# Patient Record
Sex: Male | Born: 1955 | Race: Black or African American | Hispanic: No | Marital: Single | Smoking: Former smoker
Health system: Southern US, Community
[De-identification: ages and names within clinical notes are randomized; demographics above are authoritative.]

## PROBLEM LIST (undated history)

## (undated) DIAGNOSIS — Z5189 Encounter for other specified aftercare: Secondary | ICD-10-CM

## (undated) DIAGNOSIS — I10 Essential (primary) hypertension: Secondary | ICD-10-CM

## (undated) HISTORY — PX: APPENDECTOMY: SHX54

## (undated) HISTORY — DX: Essential (primary) hypertension: I10

## (undated) HISTORY — DX: Encounter for other specified aftercare: Z51.89

## (undated) HISTORY — PX: TOTAL HIP ARTHROPLASTY: SHX124

---

## 2012-01-11 ENCOUNTER — Encounter (HOSPITAL_COMMUNITY): Payer: Self-pay | Admitting: *Deleted

## 2012-01-11 ENCOUNTER — Emergency Department (HOSPITAL_COMMUNITY)
Admission: EM | Admit: 2012-01-11 | Discharge: 2012-01-11 | Disposition: A | Discharge status: Home / Self Care | Payer: Self-pay | Attending: Emergency Medicine | Admitting: Emergency Medicine

## 2012-01-11 DIAGNOSIS — M62838 Other muscle spasm: Secondary | ICD-10-CM

## 2012-01-11 DIAGNOSIS — Y9269 Other specified industrial and construction area as the place of occurrence of the external cause: Secondary | ICD-10-CM | POA: Insufficient documentation

## 2012-01-11 DIAGNOSIS — X500XXA Overexertion from strenuous movement or load, initial encounter: Secondary | ICD-10-CM | POA: Insufficient documentation

## 2012-01-11 DIAGNOSIS — M5380 Other specified dorsopathies, site unspecified: Secondary | ICD-10-CM | POA: Insufficient documentation

## 2012-01-11 MED ORDER — CYCLOBENZAPRINE HCL 10 MG PO TABS: 10.0000 mg | ORAL_TABLET | Freq: Two times a day (BID) | ORAL | Status: AC | PRN

## 2012-01-11 NOTE — ED Provider Notes (Signed)
History     CSN: [id number]  Arrival date & time 01/11/12  7829   First MD Initiated Contact with Patient 01/11/12 0803      8:29 AM HPI Patient reports yesterday he was working in the [employer]. Reports lifting and bending excessively. States waking up this morning with severe lower back spasms. Reports trying BenGay without relief. States similar symptoms 4 months ago. Denies radiation of pain into lower extremities. Denies numbness, tingling, weakness, saddle anesthesias, perineal numbness, incontinence, abdominal pain, nausea, vomiting, dysuria, hematuria.  Patient is a 56 y.o. male presenting with back pain.  Back Pain  This is a new problem. The current episode started yesterday. The problem occurs constantly. The problem has been gradually worsening. The pain is associated with lifting heavy objects. The pain is present in the lumbar spine. The quality of the pain is described as aching. The pain does not radiate. The pain is moderate. The symptoms are aggravated by bending, certain positions and twisting. Pertinent negatives include no chest pain, no fever, no numbness, no headaches, no abdominal pain, no bowel incontinence, no perianal numbness, no bladder incontinence, no dysuria, no pelvic pain, no leg pain, no paresthesias, no paresis, no tingling and no weakness.    History reviewed. No pertinent past medical history.  History reviewed. No pertinent past surgical history.  History reviewed. No pertinent family history.  History  Substance Use Topics  . Smoking status: Not on file  . Smokeless tobacco: Not on file  . Alcohol Use: Not on file      Review of Systems  Constitutional: Negative for fever and chills.  HENT: Negative for neck pain.   Respiratory: Negative for cough and shortness of breath.   Cardiovascular: Negative for chest pain and palpitations.  Gastrointestinal: Negative for nausea, vomiting, abdominal pain and bowel incontinence.  Genitourinary:  Negative for bladder incontinence, dysuria, hematuria, flank pain and pelvic pain.  Musculoskeletal: Positive for back pain (spasms). Negative for myalgias and gait problem.       Denies saddle anesthesias, perineal numbness, bowel incontinence, urinary incontinence  Neurological: Negative for dizziness, tingling, weakness, numbness, headaches and paresthesias.  All other systems reviewed and are negative.    Allergies  Review of patient's allergies indicates no known allergies.  Home Medications  No current outpatient prescriptions on file.  BP 125/88  Pulse 68  Temp 98 F (36.7 C) (Oral)  Resp 20  SpO2 97%  Physical Exam  Vitals reviewed. Constitutional: He is oriented to person, place, and time. He appears well-developed and well-nourished.  HENT:  Head: Normocephalic and atraumatic.  Eyes: Conjunctivae are normal. Pupils are equal, round, and reactive to light.  Neck: Normal range of motion. Neck supple.  Cardiovascular: Normal rate, regular rhythm and normal heart sounds.   Pulmonary/Chest: Effort normal and breath sounds normal.  Abdominal: Soft. Bowel sounds are normal.  Musculoskeletal:       Lumbar back: He exhibits tenderness (Left lower lumbar tenderness with palpation. neg SLR. NML distal pulses and sensation. Full ROM. NML gait). He exhibits no bony tenderness.  Neurological: He is alert and oriented to person, place, and time.  Skin: Skin is warm and dry. No rash noted. No erythema. No pallor.  Psychiatric: He has a normal mood and affect. His behavior is normal.    ED Course  Procedures   MDM    Patient atraumatic injury to lower back. No signs of compression syndrome. No signs of urinary tract infection. Presentation and exam most consistent  with muscle strain. Will discharge with muscle relaxants and torso referral. Advised return for worsening symptoms. Patient voices understanding and is ready for discharge       Thomasene Lot, Cordelia Poche 01/11/12  1610

## 2012-01-11 NOTE — Discharge Instructions (Signed)
Muscle Cramps  Muscle cramps are due to sudden involuntary muscle contraction. This means you have no control over the tightening of a muscle (or muscles). Often there are no obvious causes. Muscle cramps may occur with overexertion. They may also occur with chilling of the muscles. An example of a muscle chilling activity is swimming. It is uncommon for cramps to be due to a serious underlying disorder. In most cases, muscle cramps improve (or leave) within minutes.  CAUSES   Some common causes are:   Injury.   Infections, especially viral.   Abnormal levels of the salts and ions in your blood (electrolytes). This could happen if you are taking water pills (diuretics).   Blood vessel disease where not enough blood is getting to the muscles (intermittent claudication).  Some uncommon causes are:   Side effects of some medicine (such as lithium).   Alcohol abuse.   Diseases where there is soreness (inflammation) of the muscular system.  HOME CARE INSTRUCTIONS    It may be helpful to massage, stretch, and relax the affected muscle.   Taking a dose of over-the-counter diphenhydramine is helpful for night leg cramps.  SEEK MEDICAL CARE IF:   Cramps are frequent and not relieved with medicine.  MAKE SURE YOU:    Understand these instructions.   Will watch your condition.   Will get help right away if you are not doing well or get worse.  Document Released: 01/05/2002 Document Revised: 07/05/2011 Document Reviewed: 07/07/2008  [employer] Patient Information 2012 [employer].

## 2012-01-11 NOTE — ED Notes (Signed)
Pt reports having working at an [employer] yesterday and bending down a lot.  Reports back muscle spasms today-hs of.  Pt ambulatory.

## 2012-01-12 NOTE — ED Provider Notes (Signed)
Medical screening examination/treatment/procedure(s) were performed by non-physician practitioner and as supervising physician I was immediately available for consultation/collaboration.   Suzi Roots, MD 01/12/12 [phone]

## 2012-03-28 ENCOUNTER — Emergency Department (INDEPENDENT_AMBULATORY_CARE_PROVIDER_SITE_OTHER)
Admission: EM | Admit: 2012-03-28 | Discharge: 2012-03-28 | Disposition: A | Discharge status: Home / Self Care | Payer: Self-pay | Source: Home / Self Care | Attending: Emergency Medicine | Admitting: Emergency Medicine

## 2012-03-28 ENCOUNTER — Encounter (HOSPITAL_COMMUNITY): Payer: Self-pay | Admitting: Emergency Medicine

## 2012-03-28 DIAGNOSIS — J209 Acute bronchitis, unspecified: Secondary | ICD-10-CM

## 2012-03-28 DIAGNOSIS — L509 Urticaria, unspecified: Secondary | ICD-10-CM

## 2012-03-28 DIAGNOSIS — I10 Essential (primary) hypertension: Secondary | ICD-10-CM

## 2012-03-28 MED ORDER — BENZONATATE 200 MG PO CAPS: 200.0000 mg | ORAL_CAPSULE | Freq: Three times a day (TID) | ORAL | Status: AC | PRN

## 2012-03-28 MED ORDER — FEXOFENADINE HCL 180 MG PO TABS: 180.0000 mg | ORAL_TABLET | Freq: Every day | ORAL | Status: DC

## 2012-03-28 MED ORDER — AMOXICILLIN 500 MG PO CAPS: 1000.0000 mg | ORAL_CAPSULE | Freq: Three times a day (TID) | ORAL | Status: DC

## 2012-03-28 MED ORDER — BENZONATATE 200 MG PO CAPS: 200.0000 mg | ORAL_CAPSULE | Freq: Three times a day (TID) | ORAL | Status: DC | PRN

## 2012-03-28 MED ORDER — METHYLPREDNISOLONE ACETATE 80 MG/ML IJ SUSP
INTRAMUSCULAR | Status: AC
  Filled 2012-03-28: qty 1

## 2012-03-28 MED ORDER — PREDNISONE 5 MG PO KIT: 1.0000 | PACK | Freq: Every day | ORAL | Status: DC

## 2012-03-28 MED ORDER — AMOXICILLIN 500 MG PO CAPS: 1000.0000 mg | ORAL_CAPSULE | Freq: Three times a day (TID) | ORAL | Status: AC

## 2012-03-28 MED ORDER — METHYLPREDNISOLONE ACETATE 80 MG/ML IJ SUSP
80.0000 mg | Freq: Once | INTRAMUSCULAR | Status: AC
  Administered 2012-03-28: 80 mg via INTRAMUSCULAR

## 2012-03-28 NOTE — ED Provider Notes (Signed)
Chief Complaint  Patient presents with  . Allergic Reaction    History of Present Illness:   The patient comes in today because of allergic reaction to the Mucinex. He had some cold-like symptoms a week ago with nasal congestion, rhinorrhea, chills, sweats, and cough productive green sputum. Because of this she started taking Mucinex 4 days ago. Last night he developed an urticarial rash on his face, trunk, arms, and legs. This was very itchy. He denies any difficulty breathing, wheezing, or chest tightness. No swelling of his lips, tongue, or throat. He has not had any fever or chills. No prior history of allergy to Mucinex. He has not taken or been exposed to anything else that could possibly have caused this allergic reaction.  Review of Systems:  Other than noted above, the patient denies any of the following symptoms: Systemic:  No fever, chills, sweats, weight loss, or fatigue. ENT:  No nasal congestion, rhinorrhea, sore throat, swelling of lips, tongue or throat. Resp:  No cough, wheezing, or shortness of breath. Skin:  No rash, itching, nodules, or suspicious lesions.  PMFSH:  Past medical history, family history, social history, meds, and allergies were reviewed.  Physical Exam:   Vital signs:  BP 146/84  Pulse 94  Temp 99.1 F (37.3 C) (Oral)  Resp 18  SpO2 98% Gen:  Alert, oriented, in no distress. ENT:  Pharynx clear, no intraoral lesions, moist mucous membranes. Lungs:  Clear to auscultation. Skin:  His skin right now is clear without any urticarial rash or breaking out.  Assessment:  The primary encounter diagnosis was Urticaria. Diagnoses of Acute bronchitis and Hypertension were also pertinent to this visit.  He appears to have an allergic reaction to Mucinex, so he was told not to take anything with Mucinex or guaifenesin and again. He was given fexofenadine and prednisone just in case his symptoms should come back and benzonatate and amoxicillin for the diagnosis of  bronchitis. His blood pressure is elevated today and he was urged to followup with her primary care physician as soon as possible with regard to this.  Plan:   1.  The following meds were prescribed:   New Prescriptions   AMOXICILLIN (AMOXIL) 500 MG CAPSULE    Take 2 capsules (1,000 mg total) by mouth 3 (three) times daily.   BENZONATATE (TESSALON) 200 MG CAPSULE    Take 1 capsule (200 mg total) by mouth 3 (three) times daily as needed for cough.   FEXOFENADINE (ALLEGRA) 180 MG TABLET    Take 1 tablet (180 mg total) by mouth daily.   PREDNISONE 5 MG KIT    Take 1 kit (5 mg total) by mouth daily after breakfast. Prednisone 5 mg 6 day dosepack.  Take as directed.   2.  The patient was instructed in symptomatic care and handouts were given. 3.  The patient was told to return if becoming worse in any way, if no better in 3 or 4 days, and given some red flag symptoms that would indicate earlier return.     Reuben Likes, MD 03/28/12 2154

## 2012-03-28 NOTE — ED Notes (Signed)
Pt c/o allergic reaction to Mucinex... States he's had a cold x1 week w/cough, green flegm, and occasional fevers.... Took mucinex 3 days ago and developed hives w/swelling... States it's getting better ever since he stopped taking mucinex.

## 2013-08-09 ENCOUNTER — Emergency Department (HOSPITAL_COMMUNITY)
Admission: EM | Admit: 2013-08-09 | Discharge: 2013-08-09 | Disposition: A | Discharge status: Home / Self Care | Payer: Medicare Other | Attending: Emergency Medicine | Admitting: Emergency Medicine

## 2013-08-09 ENCOUNTER — Encounter (HOSPITAL_COMMUNITY): Payer: Self-pay | Admitting: Emergency Medicine

## 2013-08-09 DIAGNOSIS — E860 Dehydration: Secondary | ICD-10-CM

## 2013-08-09 DIAGNOSIS — I959 Hypotension, unspecified: Secondary | ICD-10-CM

## 2013-08-09 DIAGNOSIS — IMO0002 Reserved for concepts with insufficient information to code with codable children: Secondary | ICD-10-CM | POA: Insufficient documentation

## 2013-08-09 DIAGNOSIS — Z79899 Other long term (current) drug therapy: Secondary | ICD-10-CM | POA: Insufficient documentation

## 2013-08-09 DIAGNOSIS — B349 Viral infection, unspecified: Secondary | ICD-10-CM

## 2013-08-09 DIAGNOSIS — R55 Syncope and collapse: Secondary | ICD-10-CM

## 2013-08-09 DIAGNOSIS — W010XXA Fall on same level from slipping, tripping and stumbling without subsequent striking against object, initial encounter: Secondary | ICD-10-CM | POA: Insufficient documentation

## 2013-08-09 DIAGNOSIS — R52 Pain, unspecified: Secondary | ICD-10-CM | POA: Insufficient documentation

## 2013-08-09 DIAGNOSIS — B9789 Other viral agents as the cause of diseases classified elsewhere: Secondary | ICD-10-CM | POA: Insufficient documentation

## 2013-08-09 DIAGNOSIS — Y9210 Unspecified residential institution as the place of occurrence of the external cause: Secondary | ICD-10-CM | POA: Insufficient documentation

## 2013-08-09 DIAGNOSIS — Y9389 Activity, other specified: Secondary | ICD-10-CM | POA: Insufficient documentation

## 2013-08-09 DIAGNOSIS — R630 Anorexia: Secondary | ICD-10-CM | POA: Insufficient documentation

## 2013-08-09 LAB — URINALYSIS, ROUTINE W REFLEX MICROSCOPIC
BILIRUBIN URINE: NEGATIVE
Glucose, UA: NEGATIVE mg/dL
HGB URINE DIPSTICK: NEGATIVE
KETONES UR: NEGATIVE mg/dL
Leukocytes, UA: NEGATIVE
Nitrite: NEGATIVE
PROTEIN: NEGATIVE mg/dL
Specific Gravity, Urine: 1.005 (ref 1.005–1.030)
UROBILINOGEN UA: 0.2 mg/dL (ref 0.0–1.0)
pH: 6 (ref 5.0–8.0)

## 2013-08-09 LAB — CBC WITH DIFFERENTIAL/PLATELET
BASOS ABS: 0 10*3/uL (ref 0.0–0.1)
BASOS PCT: 0 % (ref 0–1)
EOS PCT: 0 % (ref 0–5)
Eosinophils Absolute: 0 10*3/uL (ref 0.0–0.7)
HEMATOCRIT: 41.6 % (ref 39.0–52.0)
Hemoglobin: 13.8 g/dL (ref 13.0–17.0)
Lymphocytes Relative: 36 % (ref 12–46)
Lymphs Abs: 1.1 10*3/uL (ref 0.7–4.0)
MCH: 28 pg (ref 26.0–34.0)
MCHC: 33.2 g/dL (ref 30.0–36.0)
MCV: 84.4 fL (ref 78.0–100.0)
MONO ABS: 0.5 10*3/uL (ref 0.1–1.0)
Monocytes Relative: 16 % — ABNORMAL HIGH (ref 3–12)
Neutro Abs: 1.5 10*3/uL — ABNORMAL LOW (ref 1.7–7.7)
Neutrophils Relative %: 48 % (ref 43–77)
Platelets: 152 10*3/uL (ref 150–400)
RBC: 4.93 MIL/uL (ref 4.22–5.81)
RDW: 12.1 % (ref 11.5–15.5)
WBC: 3.2 10*3/uL — ABNORMAL LOW (ref 4.0–10.5)

## 2013-08-09 LAB — TROPONIN I

## 2013-08-09 LAB — COMPREHENSIVE METABOLIC PANEL
ALBUMIN: 3.2 g/dL — AB (ref 3.5–5.2)
ALT: 17 U/L (ref 0–53)
AST: 25 U/L (ref 0–37)
Alkaline Phosphatase: 80 U/L (ref 39–117)
BUN: 13 mg/dL (ref 6–23)
CALCIUM: 8.4 mg/dL (ref 8.4–10.5)
CO2: 24 mEq/L (ref 19–32)
CREATININE: 1.35 mg/dL (ref 0.50–1.35)
Chloride: 96 mEq/L (ref 96–112)
GFR calc Af Amer: 65 mL/min — ABNORMAL LOW (ref >90)
GFR, EST NON AFRICAN AMERICAN: 56 mL/min — AB (ref >90)
Glucose, Bld: 149 mg/dL — ABNORMAL HIGH (ref 70–99)
Potassium: 3.6 mEq/L — ABNORMAL LOW (ref 3.7–5.3)
Sodium: 135 mEq/L — ABNORMAL LOW (ref 137–147)
Total Bilirubin: 0.3 mg/dL (ref 0.3–1.2)
Total Protein: 6.9 g/dL (ref 6.0–8.3)

## 2013-08-09 LAB — GLUCOSE, CAPILLARY: Glucose-Capillary: 135 mg/dL — ABNORMAL HIGH (ref 70–99)

## 2013-08-09 LAB — LIPASE, BLOOD: LIPASE: 29 U/L (ref 11–59)

## 2013-08-09 LAB — CG4 I-STAT (LACTIC ACID): Lactic Acid, Venous: 1.71 mmol/L (ref 0.5–2.2)

## 2013-08-09 MED ORDER — ONDANSETRON 8 MG PO TBDP: 8.0000 mg | ORAL_TABLET | Freq: Three times a day (TID) | ORAL | Status: DC | PRN

## 2013-08-09 MED ORDER — SODIUM CHLORIDE 0.9 % IV SOLN
1000.0000 mL | Freq: Once | INTRAVENOUS | Status: AC
  Administered 2013-08-09: 1000 mL via INTRAVENOUS

## 2013-08-09 MED ORDER — SODIUM CHLORIDE 0.9 % IV SOLN
1000.0000 mL | INTRAVENOUS | Status: DC
  Administered 2013-08-09: 1000 mL via INTRAVENOUS

## 2013-08-09 MED ORDER — SODIUM CHLORIDE 0.9 % IV BOLUS (SEPSIS)
1000.0000 mL | Freq: Once | INTRAVENOUS | Status: AC
  Administered 2013-08-09: 1000 mL via INTRAVENOUS

## 2013-08-09 NOTE — ED Notes (Signed)
Pt states understanding of discharge instructions 

## 2013-08-09 NOTE — ED Provider Notes (Signed)
CSN: [id number]     Arrival date & time 08/09/13  0123 History   First MD Initiated Contact with Patient 08/09/13 0144     Chief Complaint  Patient presents with  . Fall  . Hypotension   (Consider location/radiation/quality/duration/timing/severity/associated sxs/prior Treatment) HPI 58 year old male presents to emergency department with complaint of syncope.  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  Patient over the last 3-4 days has had generalized myalgias, subjective fevers and cold chills.  Staff reports he has not eat or drink anything do to anorexia, and feeling poorly.  Tonight, patient felt dizzy, then passed out.  Nurse at the group home, felt that patient was hot and his pain placed in a cool bath.  Patient was then brought to the emergency department.  Initial blood pressure 65/42 upon arrival.  Patient with pain of low back pain secondary to fall earlier tonight.  He denies any abdominal pain, no nausea no vomiting.  No diarrhea.  He denies any cough.  No URI symptoms.  No urinary symptoms.  Patient reports he wishes to eat, but has no appetite. History reviewed. No pertinent past medical history. History reviewed. No pertinent past surgical history. No family history on file. History  Substance Use Topics  . Smoking status: Never Smoker   . Smokeless tobacco: Not on file  . Alcohol Use: No    Review of Systems  See History of Present Illness; otherwise all other systems are reviewed and negative Allergies  Review of patient's allergies indicates no known allergies.  Home Medications   Current Outpatient Rx  Name  Route  Sig  Dispense  Refill  . EXPIRED: fexofenadine (ALLEGRA) 180 MG tablet   Oral   Take 1 tablet (180 mg total) by mouth daily.   15 tablet   0   . PredniSONE 5 MG KIT   Oral   Take 1 kit (5 mg total) by mouth daily after breakfast. Prednisone 5 mg 6 day dosepack.  Take as directed.   1 kit   0    BP 95/62  Pulse 59   Resp 13  SpO2 98% Physical Exam  Nursing note and vitals reviewed. Constitutional: He is oriented to person, place, and time. He appears well-developed and well-nourished. He appears distressed (ill-appearing).  HENT:  Head: Normocephalic and atraumatic.  Right Ear: External ear normal.  Left Ear: External ear normal.  Nose: Nose normal.  Mouth/Throat: Oropharynx is clear and moist.  Eyes: Conjunctivae and EOM are normal. Pupils are equal, round, and reactive to light.  Neck: Normal range of motion. Neck supple. No JVD present. No tracheal deviation present. No thyromegaly present.  Cardiovascular: Normal rate, regular rhythm, normal heart sounds and intact distal pulses.  Exam reveals no gallop and no friction rub.   No murmur heard. Pulmonary/Chest: Effort normal and breath sounds normal. No stridor. No respiratory distress. He has no wheezes. He has no rales. He exhibits no tenderness.  Abdominal: Soft. He exhibits no distension and no mass. There is no tenderness. There is no rebound and no guarding.  Hyperactive bowel sounds  Musculoskeletal: Normal range of motion. He exhibits no edema and no tenderness.  Lymphadenopathy:    He has no cervical adenopathy.  Neurological: He is alert and oriented to person, place, and time. He has normal reflexes. No cranial nerve deficit. He exhibits normal muscle tone. Coordination normal.  Skin: Skin is warm and dry. No rash noted. No erythema. No pallor.  Psychiatric:  He has a normal mood and affect. His behavior is normal. Judgment and thought content normal.    ED Course  Procedures (including critical care time) Labs Review Labs Reviewed  CBC WITH DIFFERENTIAL - Abnormal; Notable for the following:    WBC 3.2 (*)    Neutro Abs 1.5 (*)    Monocytes Relative 16 (*)    All other components within normal limits  COMPREHENSIVE METABOLIC PANEL - Abnormal; Notable for the following:    Sodium 135 (*)    Potassium 3.6 (*)    Glucose, Bld  149 (*)    Albumin 3.2 (*)    GFR calc non Af Amer 56 (*)    GFR calc Af Amer 65 (*)    All other components within normal limits  GLUCOSE, CAPILLARY - Abnormal; Notable for the following:    Glucose-Capillary 135 (*)    All other components within normal limits  CULTURE, BLOOD (ROUTINE X 2)  CULTURE, BLOOD (ROUTINE X 2)  RESPIRATORY VIRUS PANEL  LIPASE, BLOOD  TROPONIN I  URINALYSIS, ROUTINE W REFLEX MICROSCOPIC  CG4 I-STAT (LACTIC ACID)   Imaging Review No results found.  EKG Interpretation    Date/Time:  Sunday August 09 2013 01:45:22 EST Ventricular Rate:  65 PR Interval:  163 QRS Duration: 86 QT Interval:  415 QTC Calculation: 431 R Axis:   3 Text Interpretation:  Sinus rhythm LAE, consider biatrial enlargement Probable left ventricular hypertrophy Minimal ST elevation, anterolateral leads No old tracing to compare Confirmed by Katrese Shell  MD, Anjel Pardo ([id number]) on 08/09/2013 2:43:10 AM            MDM   1. Dehydration   2. Hypotension   3. Syncope   4. Viral syndrome    58 year old male with syncope and hypotension.  Concern for possible possible sepsis, but afebrile here, no source of infection noted on exam.  Patient has leukopenia.  Labs pending.  Fluids being given.  Expect given his initial blood pressure will need admission, but will await labs  6:13 AM Wishes workup here in the emergency room has been negative.  He has responded excellently to IV fluids.  He is no longer orthostatic.  His blood pressure is normal.  No signs of infection.  Suspect dehydration from not eating for the last 2-3 days.  His cause for symptoms.  Patient feels comfortable going home, group home staff member also feels comfortable caring for him at home.  Plan to discharge  Kalman Drape, MD 08/09/13 [phone]

## 2013-08-09 NOTE — ED Notes (Signed)
Pt. felt dizzy / lost his balance and fell this evening , no LOC , presents with low back pain and hypotensive at triage , alert and oriented / respirations unlabored .

## 2013-08-09 NOTE — Discharge Instructions (Signed)
Dehydration, Adult Dehydration is when you lose more fluids from the body than you take in. Vital organs like the kidneys, brain, and heart cannot function without a proper amount of fluids and salt. Any loss of fluids from the body can cause dehydration.  CAUSES   Vomiting.  Diarrhea.  Excessive sweating.  Excessive urine output.  Fever. SYMPTOMS  Mild dehydration  Thirst.  Dry lips.  Slightly dry mouth. Moderate dehydration  Very dry mouth.  Sunken eyes.  Skin does not bounce back quickly when lightly pinched and released.  Dark urine and decreased urine production.  Decreased tear production.  Headache. Severe dehydration  Very dry mouth.  Extreme thirst.  Rapid, weak pulse (more than 100 beats per minute at rest).  Cold hands and feet.  Not able to sweat in spite of heat and temperature.  Rapid breathing.  Blue lips.  Confusion and lethargy.  Difficulty being awakened.  Minimal urine production.  No tears. DIAGNOSIS  Your caregiver will diagnose dehydration based on your symptoms and your exam. Blood and urine tests will help confirm the diagnosis. The diagnostic evaluation should also identify the cause of dehydration. TREATMENT  Treatment of mild or moderate dehydration can often be done at home by increasing the amount of fluids that you drink. It is best to drink small amounts of fluid more often. Drinking too much at one time can make vomiting worse. Refer to the home care instructions below. Severe dehydration needs to be treated at the hospital where you will probably be given intravenous (IV) fluids that contain water and electrolytes. HOME CARE INSTRUCTIONS   Ask your caregiver about specific rehydration instructions.  Drink enough fluids to keep your urine clear or pale yellow.  Drink small amounts frequently if you have nausea and vomiting.  Eat as you normally do.  Avoid:  Foods or drinks high in sugar.  Carbonated  drinks.  Juice.  Extremely hot or cold fluids.  Drinks with caffeine.  Fatty, greasy foods.  Alcohol.  Tobacco.  Overeating.  Gelatin desserts.  Wash your hands well to avoid spreading bacteria and viruses.  Only take over-the-counter or prescription medicines for pain, discomfort, or fever as directed by your caregiver.  Ask your caregiver if you should continue all prescribed and over-the-counter medicines.  Keep all follow-up appointments with your caregiver. SEEK MEDICAL CARE IF:  You have abdominal pain and it increases or stays in one area (localizes).  You have a rash, stiff neck, or severe headache.  You are irritable, sleepy, or difficult to awaken.  You are weak, dizzy, or extremely thirsty. SEEK IMMEDIATE MEDICAL CARE IF:   You are unable to keep fluids down or you get worse despite treatment.  You have frequent episodes of vomiting or diarrhea.  You have blood or green matter (bile) in your vomit.  You have blood in your stool or your stool looks black and tarry.  You have not urinated in 6 to 8 hours, or you have only urinated a small amount of very dark urine.  You have a fever.  You faint. MAKE SURE YOU:   Understand these instructions.  Will watch your condition.  Will get help right away if you are not doing well or get worse. Document Released: 07/16/2005 Document Revised: 10/08/2011 Document Reviewed: 03/05/2011 [facility] Patient Information 2014 [place], [place].  Hypotension As your heart beats, it forces blood through your arteries. This force is your blood pressure. If your blood pressure is too low for you to  go about your normal activities or to support the organs of your body, you have hypotension. Hypotension is also referred to as low blood pressure. When your blood pressure becomes too low, you may not get enough blood to your brain. As a result, you may feel weak, feel lightheaded, or develop a rapid heart rate. In a more severe  case, you may faint. CAUSES Various conditions can cause hypotension. These include:  Blood loss.  Dehydration.  Heart or endocrine problems.  Pregnancy.  Severe infection.  Not having a well-balanced diet filled with needed nutrients.  Severe allergic reactions (anaphylaxis). Some medicines, such as blood pressure medicine or water pills (diuretics), may lower your blood pressure below normal. Sometimes taking too much medicine or taking medicine not as directed can cause hypotension. TREATMENT  Hospitalization is sometimes required for hypotension if fluid or blood replacement is needed, if time is needed for medicines to wear off, or if further monitoring is needed. Treatment might include changing your diet, changing your medicines (including medicines aimed at raising your blood pressure), and use of support stockings. HOME CARE INSTRUCTIONS   Drink enough fluids to keep your urine clear or pale yellow.  Take your medicines as directed by your health care provider.  Get up slowly from reclining or sitting positions. This gives your blood pressure a chance to adjust.  Wear support stockings as directed by your health care provider.  Maintain a healthy diet by including nutritious food, such as fruits, vegetables, nuts, whole grains, and lean meats. SEEK MEDICAL CARE IF:  You have vomiting or diarrhea.  You have a fever for more than 2 3 days.  You feel more thirsty than usual.  You feel weak and tired. SEEK IMMEDIATE MEDICAL CARE IF:   You have chest pain or a fast or irregular heartbeat.  You have a loss of feeling in some part of your body, or you lose movement in your arms or legs.  You have trouble speaking.  You become sweaty or feel lightheaded.  You faint. MAKE SURE YOU:   Understand these instructions.  Will watch your condition.  Will get help right away if you are not doing well or get worse. Document Released: 07/16/2005 Document Revised:  05/06/2013 Document Reviewed: 01/16/2013 [facility] Patient Information 2014 [place], [place].  Syncope Syncope is a fainting spell. This means the person loses consciousness and drops to the ground. The person is generally unconscious for less than 5 minutes. The person may have some muscle twitches for up to 15 seconds before waking up and returning to normal. Syncope occurs more often in elderly people, but it can happen to anyone. While most causes of syncope are not dangerous, syncope can be a sign of a serious medical problem. It is important to seek medical care.  CAUSES  Syncope is caused by a sudden decrease in blood flow to the brain. The specific cause is often not determined. Factors that can trigger syncope include:  Taking medicines that lower blood pressure.  Sudden changes in posture, such as standing up suddenly.  Taking more medicine than prescribed.  Standing in one place for too long.  Seizure disorders.  Dehydration and excessive exposure to heat.  Low blood sugar (hypoglycemia).  Straining to have a bowel movement.  Heart disease, irregular heartbeat, or other circulatory problems.  Fear, emotional distress, seeing blood, or severe pain. SYMPTOMS  Right before fainting, you may:  Feel dizzy or lightheaded.  Feel nauseous.  See all white or all  black in your field of vision.  Have cold, clammy skin. DIAGNOSIS  Your caregiver will ask about your symptoms, perform a physical exam, and perform electrocardiography (ECG) to record the electrical activity of your heart. Your caregiver may also perform other heart or blood tests to determine the cause of your syncope. TREATMENT  In most cases, no treatment is needed. Depending on the cause of your syncope, your caregiver may recommend changing or stopping some of your medicines. HOME CARE INSTRUCTIONS  Have someone stay with you until you feel stable.  Do not drive, operate machinery, or play sports until your  caregiver says it is okay.  Keep all follow-up appointments as directed by your caregiver.  Lie down right away if you start feeling like you might faint. Breathe deeply and steadily. Wait until all the symptoms have passed.  Drink enough fluids to keep your urine clear or pale yellow.  If you are taking blood pressure or heart medicine, get up slowly, taking several minutes to sit and then stand. This can reduce dizziness. SEEK IMMEDIATE MEDICAL CARE IF:   You have a severe headache.  You have unusual pain in the chest, abdomen, or back.  You are bleeding from the mouth or rectum, or you have black or tarry stool.  You have an irregular or very fast heartbeat.  You have pain with breathing.  You have repeated fainting or seizure-like jerking during an episode.  You faint when sitting or lying down.  You have confusion.  You have difficulty walking.  You have severe weakness.  You have vision problems. If you fainted, call your local emergency services (911 in [place] Do not drive yourself to the hospital.  MAKE SURE YOU:  Understand these instructions.  Will watch your condition.  Will get help right away if you are not doing well or get worse. Document Released: 07/16/2005 Document Revised: 01/15/2012 Document Reviewed: 09/14/2011 [facility] Patient Information 2014 [place], [place].  Rehydration, Adult Rehydration is the replacement of body fluids lost during dehydration. Dehydration is an extreme loss of body fluids to the point of body function impairment. There are many ways extreme fluid loss can occur, including vomiting, diarrhea, or excess sweating. Recovering from dehydration requires replacing lost fluids, continuing to eat to maintain strength, and avoiding foods and beverages that may contribute to further fluid loss or may increase nausea. HOW TO REHYDRATE In most cases, rehydration involves the replacement of not only fluids but also carbohydrates and basic  body salts. Rehydration with an oral rehydration solution is one way to replace essential nutrients lost through dehydration. An oral rehydration solution can be purchased at pharmacies, retail stores, and online. Premixed packets of powder that you combine with water to make a solution are also sold. You can prepare an oral rehydration solution at home by mixing the following ingredients together:      tsp table salt.   tsp baking soda.   tsp salt substitute containing potassium chloride.  1 tablespoons sugar.  1 L (34 oz) of water. Be sure to use exact measurements. Including too much sugar can make diarrhea worse. Drink  1 cup (120 240 mL) of oral rehydration solution each time you have diarrhea or vomit. If drinking this amount makes your vomiting worse, try drinking smaller amounts more often. For example, drink 1 3 tsp every 5 10 minutes.  A general rule for staying hydrated is to drink 1 2 L of fluid per day. Talk to your caregiver about the specific  amount you should be drinking each day. Drink enough fluids to keep your urine clear or pale yellow. EATING WHEN DEHYDRATED Even if you have had severe sweating or you are having diarrhea, do not stop eating. Many healthy items in a normal diet are okay to continue eating while recovering from dehydration. The following tips can help you to lessen nausea when you eat:  Ask someone else to prepare your food. Cooking smells may worsen nausea.  Eat in a well-ventilated room away from cooking smells.  Sit up when you eat. Avoid lying down until 1 2 hours after eating.  Eat small amounts when you eat.  Eat foods that are easy to digest. These include soft, well-cooked, or mashed foods. FOODS AND BEVERAGES TO AVOID Avoid eating or drinking the following foods and beverages that may increase nausea or further loss of fluid:   Fruit juices with a high sugar content, such as concentrated juices.  Alcohol.  Beverages containing  caffeine.  Carbonated drinks. They may cause a lot of gas.  Foods that may cause a lot of gas, such as cabbage, broccoli, and beans.  Fatty, greasy, and fried foods.  Spicy, very salty, and very sweet foods or drinks.  Foods or drinks that are very hot or very cold. Consume food or drinks at or near room temperature.  Foods that need a lot of chewing, such as raw vegetables.  Foods that are sticky or hard to swallow, such as peanut butter. Document Released: 10/08/2011 Document Revised: 04/09/2012 Document Reviewed: 10/08/2011 [facility] Patient Information 2014 [place], [place].  Viral Infections A viral infection can be caused by different types of viruses.Most viral infections are not serious and resolve on their own. However, some infections may cause severe symptoms and may lead to further complications. SYMPTOMS Viruses can frequently cause:  Minor sore throat.  Aches and pains.  Headaches.  Runny nose.  Different types of rashes.  Watery eyes.  Tiredness.  Cough.  Loss of appetite.  Gastrointestinal infections, resulting in nausea, vomiting, and diarrhea. These symptoms do not respond to antibiotics because the infection is not caused by bacteria. However, you might catch a bacterial infection following the viral infection. This is sometimes called a "superinfection." Symptoms of such a bacterial infection may include:  Worsening sore throat with pus and difficulty swallowing.  Swollen neck glands.  Chills and a high or persistent fever.  Severe headache.  Tenderness over the sinuses.  Persistent overall ill feeling (malaise), muscle aches, and tiredness (fatigue).  Persistent cough.  Yellow, green, or brown mucus production with coughing. HOME CARE INSTRUCTIONS   Only take over-the-counter or prescription medicines for pain, discomfort, diarrhea, or fever as directed by your caregiver.  Drink enough water and fluids to keep your urine clear or pale  yellow. Sports drinks can provide valuable electrolytes, sugars, and hydration.  Get plenty of rest and maintain proper nutrition. Soups and broths with crackers or rice are fine. SEEK IMMEDIATE MEDICAL CARE IF:   You have severe headaches, shortness of breath, chest pain, neck pain, or an unusual rash.  You have uncontrolled vomiting, diarrhea, or you are unable to keep down fluids.  You or your child has an oral temperature above 102 F (38.9 C), not controlled by medicine.  Your baby is older than 3 months with a rectal temperature of 102 F (38.9 C) or higher.  Your baby is 55 months old or younger with a rectal temperature of 100.4 F (38 C) or higher. MAKE SURE  YOU:   Understand these instructions.  Will watch your condition.  Will get help right away if you are not doing well or get worse. Document Released: 04/25/2005 Document Revised: 10/08/2011 Document Reviewed: 11/20/2010 [facility] Patient Information 2014 [place], [place].

## 2013-08-10 LAB — RESPIRATORY VIRUS PANEL
ADENOVIRUS: NOT DETECTED
INFLUENZA A H1: NOT DETECTED
INFLUENZA A: DETECTED — AB
Influenza A H3: NOT DETECTED
Influenza B: NOT DETECTED
METAPNEUMOVIRUS: NOT DETECTED
PARAINFLUENZA 3 A: NOT DETECTED
Parainfluenza 1: NOT DETECTED
Parainfluenza 2: NOT DETECTED
RHINOVIRUS: NOT DETECTED
Respiratory Syncytial Virus A: NOT DETECTED
Respiratory Syncytial Virus B: NOT DETECTED

## 2013-08-15 LAB — CULTURE, BLOOD (ROUTINE X 2)
CULTURE: NO GROWTH
CULTURE: NO GROWTH

## 2016-05-18 ENCOUNTER — Emergency Department (HOSPITAL_COMMUNITY): Payer: Medicare Other

## 2016-05-18 ENCOUNTER — Encounter (HOSPITAL_COMMUNITY): Payer: Self-pay

## 2016-05-18 ENCOUNTER — Emergency Department (HOSPITAL_COMMUNITY)
Admission: EM | Admit: 2016-05-18 | Discharge: 2016-05-18 | Disposition: A | Discharge status: Home / Self Care | Payer: Medicare Other | Attending: Emergency Medicine | Admitting: Emergency Medicine

## 2016-05-18 DIAGNOSIS — Y939 Activity, unspecified: Secondary | ICD-10-CM | POA: Diagnosis not present

## 2016-05-18 DIAGNOSIS — Y990 Civilian activity done for income or pay: Secondary | ICD-10-CM | POA: Diagnosis not present

## 2016-05-18 DIAGNOSIS — Y9289 Other specified places as the place of occurrence of the external cause: Secondary | ICD-10-CM | POA: Insufficient documentation

## 2016-05-18 DIAGNOSIS — W010XXA Fall on same level from slipping, tripping and stumbling without subsequent striking against object, initial encounter: Secondary | ICD-10-CM | POA: Diagnosis not present

## 2016-05-18 DIAGNOSIS — M25562 Pain in left knee: Secondary | ICD-10-CM | POA: Diagnosis present

## 2016-05-18 MED ORDER — MELOXICAM 15 MG PO TABS: 15.0000 mg | ORAL_TABLET | Freq: Every day | ORAL | 0 refills | Status: DC

## 2016-05-18 NOTE — Progress Notes (Signed)
Orthopedic Tech Progress Note Patient Details:  Gloriajean DellWilfred Steffenhagen [birth year] [id number]  Ortho Devices Ortho Device/Splint Location: LLE Ortho Device/Splint Interventions: Ordered, Application   Jennye MoccasinHughes, Sulayman Manning Craig 05/18/2016, 8:15 PM

## 2016-05-18 NOTE — Discharge Instructions (Signed)
Take mobic as prescribed for pain and inflammation. Ice and elevate your knee. See exercises below. If not improving follow up with orthopedics.

## 2016-05-18 NOTE — ED Notes (Signed)
Patient verbalized understanding of discharge instructions and denies any further needs or questions at this time. VS stable. Patient ambulatory with steady gait, escorted to ED entrance in wheelchair.   

## 2016-05-18 NOTE — ED Notes (Signed)
Pt c/o of left knee pain. Pt says "I slipped while working at an [employer]. Fell backwards and leg popped out"

## 2016-05-18 NOTE — ED Triage Notes (Signed)
Pt states slipped and fell onto L knee ~ 3 week ago. Pt ambulatory at triage.

## 2016-05-18 NOTE — ED Provider Notes (Signed)
[facility]-EMERGENCY DEPT Provider Note   CSN: [id number] Arrival date & time: 05/18/16  1722  By signing my name below, I, Placido SouLogan Joldersma, attest that this documentation has been prepared under the direction and in the presence of Raudel Bazen, PA-C. Electronically Signed: Placido SouLogan Joldersma, ED Scribe. 05/18/16. 7:15 PM.  History   Chief Complaint Chief Complaint  Patient presents with  . Knee Pain    HPI HPI Comments: Darren Sherman is a 60 y.o. male who presents to the Emergency Department complaining of a mechanical fall that occurred three weeks ago. He slipped on a wet surface, fell backwards and experienced a sudden onset of left knee pain. Pt reports associated, mild, left knee swelling. He states he has been doing exercises with the knee w/o significant relief. His pain worsens with movement. He denies having been evaluated for his pain. No other associated symptoms at this time.   PCP: None   The history is provided by the patient. No language interpreter was used.    History reviewed. No pertinent past medical history.  There are no active problems to display for this patient.   History reviewed. No pertinent surgical history.     Home Medications    Prior to Admission medications   Medication Sig Start Date End Date Taking? Authorizing Provider  fexofenadine (ALLEGRA) 180 MG tablet Take 1 tablet (180 mg total) by mouth daily. 03/28/12 03/28/13  Reuben Likesavid C Keller, MD  ondansetron (ZOFRAN ODT) 8 MG disintegrating tablet Take 1 tablet (8 mg total) by mouth every 8 (eight) hours as needed for nausea or vomiting. 08/09/13   Marisa Severinlga Otter, MD    Family History History reviewed. No pertinent family history.  Social History Social History  Substance Use Topics  . Smoking status: Never Smoker  . Smokeless tobacco: Never Used  . Alcohol use No     Allergies   Review of patient's allergies indicates no known allergies.   Review of Systems Review of Systems    Musculoskeletal: Positive for arthralgias and joint swelling.  Skin: Negative for color change and wound.  Neurological: Negative for numbness.   Physical Exam Updated Vital Signs BP 141/83   Pulse 75   Temp 98.3 F (36.8 C) (Oral)   Resp 14   SpO2 97%   Physical Exam  Constitutional: He is oriented to person, place, and time. He appears well-developed and well-nourished.  HENT:  Head: Normocephalic and atraumatic.  Eyes: EOM are normal.  Neck: Normal range of motion.  Cardiovascular: Normal rate.   Pulmonary/Chest: Effort normal. No respiratory distress.  Musculoskeletal: Normal range of motion.  Normal-appearing left knee with no obvious deformity or swelling. Tenderness to palpation over the inferior patella tendon. Full range of motion of the knee. Negative anterior and posterior drawer signs. No laxity with medial or lateral stress. Dorsal pedal pulses intact.  Neurological: He is alert and oriented to person, place, and time.  Skin: Skin is warm and dry.  Psychiatric: He has a normal mood and affect.  Nursing note and vitals reviewed.  ED Treatments / Results  Labs (all labs ordered are listed, but only abnormal results are displayed) Labs Reviewed - No data to display  EKG  EKG Interpretation None       Radiology Dg Knee Complete 4 Views Left  Result Date: 05/18/2016 CLINICAL DATA:  Left knee pain, injury 3 weeks ago EXAM: LEFT KNEE - COMPLETE 4+ VIEW COMPARISON:  None. FINDINGS: Four views of the left knee submitted. No acute fracture  or subluxation. Mild prepatellar soft tissue swelling. There is spurring of medial femoral condyle. Small calcification of medial collateral ligament. No joint effusion. No radiopaque foreign body. IMPRESSION: No acute fracture or subluxation. Spurring of medial femoral condyle. Small calcification medial collateral ligament. No joint effusion. Mild prepatellar soft tissue swelling. Electronically Signed   By: Natasha Mead M.D.   On:  05/18/2016 19:51    Procedures Procedures  DIAGNOSTIC STUDIES: Oxygen Saturation is 97% on RA, normal by my interpretation.    COORDINATION OF CARE: 7:15 PM Discussed next steps with pt. Pt verbalized understanding and is agreeable with the plan.    Medications Ordered in ED Medications - No data to display   Initial Impression / Assessment and Plan / ED Course  I have reviewed the triage vital signs and the nursing notes.  Pertinent labs & imaging results that were available during my care of the patient were reviewed by me and considered in my medical decision making (see chart for details).  Clinical Course    Patient X-Ray negative for obvious fracture or dislocation.  Pt advised to follow up with orthopedics. Patient given knee sleeve, NSAIDs and recommended knee exercises while in ED, conservative therapy recommended and discussed. Patient will be discharged home & is agreeable with above plan. Returns precautions discussed. Pt appears safe for discharge.  Vitals:   05/18/16 1745  BP: 141/83  Pulse: 75  Resp: 14  Temp: 98.3 F (36.8 C)  TempSrc: Oral  SpO2: 97%    I personally performed the services described in this documentation, which was scribed in my presence. The recorded information has been reviewed and is accurate.   Final Clinical Impressions(s) / ED Diagnoses   Final diagnoses:  Acute pain of left knee    New Prescriptions New Prescriptions   MELOXICAM (MOBIC) 15 MG TABLET    Take 1 tablet (15 mg total) by mouth daily.     Jaynie Crumble, PA-C 05/18/16 2010    Benjiman Core, MD 05/18/16 2317

## 2016-06-05 ENCOUNTER — Ambulatory Visit (INDEPENDENT_AMBULATORY_CARE_PROVIDER_SITE_OTHER): Payer: Medicare Other | Admitting: Orthopedic Surgery

## 2016-06-05 ENCOUNTER — Encounter (INDEPENDENT_AMBULATORY_CARE_PROVIDER_SITE_OTHER): Payer: Self-pay | Admitting: Orthopedic Surgery

## 2016-06-05 VITALS — Ht 64.0 in | Wt 160.0 lb

## 2016-06-05 DIAGNOSIS — M25562 Pain in left knee: Secondary | ICD-10-CM

## 2016-06-05 MED ORDER — LIDOCAINE HCL 1 % IJ SOLN
5.0000 mL | INTRAMUSCULAR | Status: AC | PRN
  Administered 2016-06-05: 5 mL

## 2016-06-05 MED ORDER — METHYLPREDNISOLONE ACETATE 40 MG/ML IJ SUSP
40.0000 mg | INTRAMUSCULAR | Status: AC | PRN
  Administered 2016-06-05: 40 mg via INTRA_ARTICULAR

## 2016-06-05 NOTE — Progress Notes (Signed)
Office Visit Note   Patient: Darren Sherman           Date of Birth: 1955-11-14           MRN: [id number] Visit Date: 06/05/2016              Requested by: No referring provider defined for this encounter. PCP: Pcp Not In System   Assessment & Plan: Visit Diagnoses:  1. Acute pain of left knee    Chondromalacia patellofemoral joint. Plan: Follow-up as needed. Left knee was injected with steroid.  Follow-Up Instructions: Return if symptoms worsen or fail to improve.   Orders:  Orders Placed This Encounter  Procedures  . Large Joint Injection/Arthrocentesis   No orders of the defined types were placed in this encounter.     Procedures: Large Joint Inj Date/Time: 06/05/2016 11:26 AM Performed by: Starlena Beil V Authorized by: Nadara MustardUDA, Damarco Keysor V   Consent Given by:  Patient Site marked: the procedure site was marked   Timeout: prior to procedure the correct patient, procedure, and site was verified   Indications:  Pain and diagnostic evaluation Location:  Knee Site:  L knee Prep: patient was prepped and draped in usual sterile fashion   Needle Size:  22 G Needle Length:  1.5 inches Ultrasound Guidance: No   Fluoroscopic Guidance: No   Arthrogram: No Medications:  5 mL lidocaine 1 %; 40 mg methylPREDNISolone acetate 40 MG/ML Aspiration Attempted: No   Patient tolerance:  Patient tolerated the procedure well with no immediate complications       Clinical Data: No additional findings.   Subjective: Chief Complaint  Patient presents with  . Left Knee - Pain, Injury    Patient presents today with left knee pain. He states he had a fall 04[place]17/16 at an animal shelter, he slipped and fell backward and heard a pop in his left knee. He went to the ER due to persistent pain in left knee on 05/18/16. Diagnostic xrays were obtained and were negative for fracture or dislocation but did show spurring of medial femoral condyle and soft tissue swelling. The patient states the  pain is at the bottom of his knee. He was given neoprene knee sleeve at ER which he continues to wear today. He was also given a prescription for meloxicam but he is not taking. He complains of burning and swelling with stiffness. He even begins to have pain at right knee due to overcompensation with his right leg.     Review of Systems   Objective: Vital Signs: Ht 5\' 4"  (1.626 m)   Wt 160 lb (72.6 kg)   BMI 27.46 kg/m   Physical Exam patient is alert oriented no adenopathy well-dressed the left affecting respiratory he does have an antalgic gait. Examination the left knee there is no effusion he has full range of motion with full active extension to 0 full flexion 120. Medial lateral joint line are minimally tender to palpation close increasing to stabilize nicely tender to palpation the patellofemoral joint.  Ortho Exam  Specialty Comments:  No specialty comments available.  Imaging: No results found.   PMFS History: There are no active problems to display for this patient.  No past medical history on file.  No family history on file.  History reviewed. No pertinent surgical history. Social History   Occupational History  . Not on file.   Social History Main Topics  . Smoking status: Never Smoker  . Smokeless tobacco: Never Used  . Alcohol  use No  . Drug use: No  . Sexual activity: Not on file

## 2016-06-06 ENCOUNTER — Ambulatory Visit (INDEPENDENT_AMBULATORY_CARE_PROVIDER_SITE_OTHER): Payer: Self-pay | Admitting: Orthopedic Surgery

## 2016-06-28 ENCOUNTER — Ambulatory Visit (INDEPENDENT_AMBULATORY_CARE_PROVIDER_SITE_OTHER): Payer: Medicare Other | Admitting: Orthopedic Surgery

## 2016-06-28 ENCOUNTER — Encounter (INDEPENDENT_AMBULATORY_CARE_PROVIDER_SITE_OTHER): Payer: Self-pay | Admitting: Orthopedic Surgery

## 2016-06-28 VITALS — Ht 64.0 in | Wt 160.0 lb

## 2016-06-28 DIAGNOSIS — M25562 Pain in left knee: Secondary | ICD-10-CM | POA: Diagnosis not present

## 2016-06-28 DIAGNOSIS — M1712 Unilateral primary osteoarthritis, left knee: Secondary | ICD-10-CM

## 2016-06-28 DIAGNOSIS — G8929 Other chronic pain: Secondary | ICD-10-CM | POA: Diagnosis not present

## 2016-06-28 MED ORDER — LIDOCAINE HCL 1 % IJ SOLN
5.0000 mL | INTRAMUSCULAR | Status: AC | PRN
  Administered 2016-06-28: 5 mL

## 2016-06-28 MED ORDER — METHYLPREDNISOLONE ACETATE 40 MG/ML IJ SUSP
40.0000 mg | INTRAMUSCULAR | Status: AC | PRN
  Administered 2016-06-28: 40 mg via INTRA_ARTICULAR

## 2016-06-28 NOTE — Progress Notes (Signed)
Office Visit Note   Patient: Darren Sherman           Date of Birth: 05-03-1956           MRN: [id number] Visit Date: 06/28/2016              Requested by: No referring provider defined for this encounter. PCP: Pcp Not In System   Assessment & Plan: Visit Diagnoses:  1. Unilateral primary osteoarthritis, left knee   2. Chronic pain of left knee     Plan: Patient has recurrent effusions generalized pain with no focal symptoms no signs or symptoms of meniscal pathology. Patient underwent injection for the left knee. Plan to follow-up in 4 weeks for reevaluation patient may require total knee arthroplasty. Of note patient has had 3 hip replacement surgeries [facility].  Follow-Up Instructions: Return in about 4 weeks (around 07/26/2016).   Orders:  Orders Placed This Encounter  Procedures  . Large Joint Injection/Arthrocentesis   No orders of the defined types were placed in this encounter.     Procedures: Large Joint Inj Date/Time: 06/28/2016 8:34 AM Performed by: Helen Cuff V Authorized by: Nadara MustardUDA, Amedee Cerrone V   Consent Given by:  Patient Site marked: the procedure site was marked   Timeout: prior to procedure the correct patient, procedure, and site was verified   Indications:  Pain and diagnostic evaluation Location:  Knee Site:  L knee Prep: patient was prepped and draped in usual sterile fashion   Needle Size:  22 G Needle Length:  1.5 inches Approach:  Anterolateral Ultrasound Guidance: No   Fluoroscopic Guidance: No   Arthrogram: No   Medications:  5 mL lidocaine 1 %; 40 mg methylPREDNISolone acetate 40 MG/ML Aspiration Attempted: No   Patient tolerance:  Patient tolerated the procedure well with no immediate complications      Clinical Data: No additional findings.   Subjective: Chief Complaint  Patient presents with  . Left Knee - Pain    Patient presents today with left knee pain. Last seen on 06/05/16 where he was given  cortisone injection left knee. He had relief approximately for a week and now his symptoms and pain have returned. He continues wearing neoprene knee sleeve. He complains of burning, stiffness, locking. He is on his feet a lot during the day, especially at work. He states his pain is persistent all day long.    Review of Systems   Objective: Vital Signs: Ht 5\' 4"  (1.626 m)   Wt 160 lb (72.6 kg)   BMI 27.46 kg/m   Physical Exam on examination patient is alert oriented no adenopathy well-dressed normal affect normal historian. He has an antalgic gait. Examination he does have an effusion in the left knee which is moderate. He is globally tender to palpation medial lateral joint lines closing cruciate are stable focal pain no mechanical symptoms with range of motion. He has decreased range of motion due to swelling. Radiographs show some periarticular bone East burs but no significant joint space narrowing.  Ortho Exam  Specialty Comments:  No specialty comments available.  Imaging: No results found.   PMFS History: Patient Active Problem List   Diagnosis Date Noted  . Unilateral primary osteoarthritis, left knee 06/28/2016   History reviewed. No pertinent past medical history.  History reviewed. No pertinent family history.  History reviewed. No pertinent surgical history. Social History   Occupational History  . Not on file.   Social History Main Topics  .  Smoking status: Never Smoker  . Smokeless tobacco: Never Used  . Alcohol use No  . Drug use: No  . Sexual activity: Not on file

## 2016-07-26 ENCOUNTER — Encounter (INDEPENDENT_AMBULATORY_CARE_PROVIDER_SITE_OTHER): Payer: Self-pay | Admitting: Orthopedic Surgery

## 2016-07-26 ENCOUNTER — Ambulatory Visit (INDEPENDENT_AMBULATORY_CARE_PROVIDER_SITE_OTHER): Payer: Medicare Other | Admitting: Orthopedic Surgery

## 2016-07-26 VITALS — Ht 64.0 in | Wt 160.0 lb

## 2016-07-26 DIAGNOSIS — M1712 Unilateral primary osteoarthritis, left knee: Secondary | ICD-10-CM

## 2016-07-26 NOTE — Progress Notes (Signed)
   Office Visit Note   Patient: Darren Sherman           Date of Birth: Apr 25, 1956           MRN: [id number] Visit Date: 07/26/2016              Requested by: No referring provider defined for this encounter. PCP: Pcp Not In System   Assessment & Plan: Visit Diagnoses:  1. Unilateral primary osteoarthritis, left knee     Plan: Continue with  Home exercise program, quad strengthening. Follow up in office as needed.   Follow-Up Instructions: Return if symptoms worsen or fail to improve.   Orders:  No orders of the defined types were placed in this encounter.  No orders of the defined types were placed in this encounter.     Procedures: No procedures performed   Clinical Data: No additional findings.   Subjective: Chief Complaint  Patient presents with  . Left Knee - Follow-up    S/p injection on 06/28/16    Patient is a 60 year old gentleman seen today in follow up for left knee pain. Last had a depomedrol injection on 06/28/16. Patient states that it was very helpful. States that there is still a "twinge" at times but he is feeling much better since this injection last month. Doing home exercises, no concerns.     Review of Systems  Constitutional: Negative for chills and fever.  Musculoskeletal: Negative for arthralgias, gait problem and joint swelling.     Objective: Vital Signs: Ht 5\' 4"  (1.626 m)   Wt 160 lb (72.6 kg)   BMI 27.46 kg/m   Physical Exam  Constitutional: He is oriented to person, place, and time. He appears well-developed and well-nourished.  Pulmonary/Chest: Effort normal.  Musculoskeletal:       Left knee: He exhibits no effusion.  Neurological: He is alert and oriented to person, place, and time.  Psychiatric: He has a normal mood and affect.  Nursing note reviewed.   Left Knee Exam  Left knee exam is normal.  Tenderness  The patient is experiencing no tenderness.     Range of Motion  The patient has normal left knee  ROM.  Tests  Varus: negative Valgus: negative  Other  Swelling: none Effusion: no effusion present      Specialty Comments:  No specialty comments available.  Imaging: No results found.   PMFS History: Patient Active Problem List   Diagnosis Date Noted  . Unilateral primary osteoarthritis, left knee 06/28/2016   No past medical history on file.  No family history on file.  No past surgical history on file. Social History   Occupational History  . Not on file.   Social History Main Topics  . Smoking status: Never Smoker  . Smokeless tobacco: Never Used  . Alcohol use No  . Drug use: No  . Sexual activity: Not on file

## 2016-10-25 ENCOUNTER — Encounter (HOSPITAL_COMMUNITY): Payer: Self-pay

## 2016-10-25 ENCOUNTER — Emergency Department (HOSPITAL_COMMUNITY)
Admission: EM | Admit: 2016-10-25 | Discharge: 2016-10-25 | Disposition: A | Discharge status: Home / Self Care | Payer: Worker's Compensation | Attending: Emergency Medicine | Admitting: Emergency Medicine

## 2016-10-25 DIAGNOSIS — Y929 Unspecified place or not applicable: Secondary | ICD-10-CM | POA: Diagnosis not present

## 2016-10-25 DIAGNOSIS — Y999 Unspecified external cause status: Secondary | ICD-10-CM | POA: Insufficient documentation

## 2016-10-25 DIAGNOSIS — Z23 Encounter for immunization: Secondary | ICD-10-CM | POA: Diagnosis not present

## 2016-10-25 DIAGNOSIS — S01551A Open bite of lip, initial encounter: Secondary | ICD-10-CM | POA: Insufficient documentation

## 2016-10-25 DIAGNOSIS — Z96649 Presence of unspecified artificial hip joint: Secondary | ICD-10-CM | POA: Diagnosis not present

## 2016-10-25 DIAGNOSIS — Y939 Activity, unspecified: Secondary | ICD-10-CM | POA: Diagnosis not present

## 2016-10-25 DIAGNOSIS — W540XXA Bitten by dog, initial encounter: Secondary | ICD-10-CM | POA: Diagnosis not present

## 2016-10-25 MED ORDER — TETANUS-DIPHTH-ACELL PERTUSSIS 5-2.5-18.5 LF-MCG/0.5 IM SUSP
0.5000 mL | Freq: Once | INTRAMUSCULAR | Status: AC
  Administered 2016-10-25: 0.5 mL via INTRAMUSCULAR
  Filled 2016-10-25: qty 0.5

## 2016-10-25 NOTE — ED Notes (Signed)
Pt is in stable condition upon d/c and will be going to Dr. Anabel Halonrab's office for repair of his lip. Pt verbalizes understanding.

## 2016-10-25 NOTE — Discharge Instructions (Signed)
Go to Dr. Kenney Housemanrab office directly from here

## 2016-10-25 NOTE — ED Provider Notes (Signed)
[facility] Provider Note   CSN: [id number] Arrival date & time: 10/25/16  0840     History   Chief Complaint Chief Complaint  Patient presents with  . Animal Bite    HPI Dayton Kenley is a 61 y.o. male.  Pt was bit today by a dog at the animal shelter   The history is provided by the patient.  Animal Bite  Contact animal:  Dog Location:  Face Facial injury location:  Face Pain details:    Quality:  Aching   Severity:  Mild   Timing:  Constant   Progression:  Unchanged Associated symptoms: no rash     History reviewed. No pertinent past medical history.  Patient Active Problem List   Diagnosis Date Noted  . Unilateral primary osteoarthritis, left knee 06/28/2016    Past Surgical History:  Procedure Laterality Date  . TOTAL HIP ARTHROPLASTY         Home Medications    Prior to Admission medications   Medication Sig Start Date End Date Taking? Authorizing Provider  ibuprofen (ADVIL,MOTRIN) 200 MG tablet Take 200 mg by mouth as needed.    Historical Provider, MD  meloxicam (MOBIC) 15 MG tablet Take 1 tablet (15 mg total) by mouth daily. 05/18/16   Tatyana Kirichenko, PA-C  ondansetron (ZOFRAN ODT) 8 MG disintegrating tablet Take 1 tablet (8 mg total) by mouth every 8 (eight) hours as needed for nausea or vomiting. 08/09/13   Marisa Severin, MD    Family History No family history on file.  Social History Social History  Substance Use Topics  . Smoking status: Never Smoker  . Smokeless tobacco: Never Used  . Alcohol use No     Allergies   Patient has no known allergies.   Review of Systems Review of Systems  Constitutional: Negative for appetite change and fatigue.  HENT: Negative for congestion, ear discharge and sinus pressure.        Dog bite to lower lip  Eyes: Negative for discharge.  Respiratory: Negative for cough.   Cardiovascular: Negative for chest pain.  Gastrointestinal: Negative for abdominal pain and diarrhea.    Genitourinary: Negative for frequency and hematuria.  Musculoskeletal: Negative for back pain.  Skin: Negative for rash.  Neurological: Negative for seizures and headaches.  Psychiatric/Behavioral: Negative for hallucinations.     Physical Exam Updated Vital Signs BP (!) 158/97 (BP Location: Right Arm)   Pulse 66   Temp 98.1 F (36.7 C) (Oral)   Resp 18   Ht 5\' 4"  (1.626 m)   Wt 160 lb (72.6 kg)   SpO2 98%   BMI 27.46 kg/m   Physical Exam  Constitutional: He is oriented to person, place, and time. He appears well-developed.  HENT:  Head: Normocephalic.  Patient has a large jagged laceration to his lower lip. Does not look like any tissue has been removed.  Eyes: Conjunctivae and EOM are normal. No scleral icterus.  Neck: Neck supple. No thyromegaly present.  Cardiovascular: Normal rate and regular rhythm.  Exam reveals no gallop and no friction rub.   No murmur heard. Pulmonary/Chest: No stridor. He has no wheezes. He has no rales. He exhibits no tenderness.  Abdominal: He exhibits no distension. There is no tenderness. There is no rebound.  Musculoskeletal: Normal range of motion. He exhibits no edema.  Lymphadenopathy:    He has no cervical adenopathy.  Neurological: He is oriented to person, place, and time. He exhibits normal muscle tone. Coordination normal.  Skin: No  rash noted. No erythema.  Psychiatric: He has a normal mood and affect. His behavior is normal.     ED Treatments / Results  Labs (all labs ordered are listed, but only abnormal results are displayed) Labs Reviewed - No data to display  EKG  EKG Interpretation None       Radiology No results found.  Procedures Procedures (including critical care time)  Medications Ordered in ED Medications  Tdap (BOOSTRIX) injection 0.5 mL (0.5 mLs Intramuscular Given 10/25/16 1034)     Initial Impression / Assessment and Plan / ED Course  I have reviewed the triage vital signs and the nursing  notes.  Pertinent labs & imaging results that were available during my care of the patient were reviewed by me and considered in my medical decision making (see chart for details).     Patient had laceration to lip. I spoke with Dr.Drab and he will take care of the patient. He is being discharged from the emergency department and he has a ride to take him to Dr.Drab office  Final Clinical Impressions(s) / ED Diagnoses   Final diagnoses:  Dog bite, initial encounter    New Prescriptions New Prescriptions   No medications on file     Bethann BerkshireJoseph Analiya Porco, MD 10/25/16 1041

## 2019-09-28 HISTORY — PX: MASS EXCISION: SHX2000

## 2019-10-13 ENCOUNTER — Ambulatory Visit (HOSPITAL_COMMUNITY)
Admission: EM | Admit: 2019-10-13 | Discharge: 2019-10-13 | Disposition: A | Discharge status: Home / Self Care | Payer: Medicare Other | Attending: Family Medicine | Admitting: Family Medicine

## 2019-10-13 ENCOUNTER — Ambulatory Visit (INDEPENDENT_AMBULATORY_CARE_PROVIDER_SITE_OTHER): Payer: Medicare Other

## 2019-10-13 ENCOUNTER — Other Ambulatory Visit: Payer: Self-pay

## 2019-10-13 ENCOUNTER — Encounter (HOSPITAL_COMMUNITY): Payer: Self-pay

## 2019-10-13 DIAGNOSIS — R2242 Localized swelling, mass and lump, left lower limb: Secondary | ICD-10-CM | POA: Diagnosis not present

## 2019-10-13 DIAGNOSIS — Z96641 Presence of right artificial hip joint: Secondary | ICD-10-CM

## 2019-10-13 DIAGNOSIS — M79661 Pain in right lower leg: Secondary | ICD-10-CM

## 2019-10-13 DIAGNOSIS — M25551 Pain in right hip: Secondary | ICD-10-CM | POA: Diagnosis not present

## 2019-10-13 DIAGNOSIS — Z042 Encounter for examination and observation following work accident: Secondary | ICD-10-CM | POA: Diagnosis not present

## 2019-10-13 DIAGNOSIS — R030 Elevated blood-pressure reading, without diagnosis of hypertension: Secondary | ICD-10-CM

## 2019-10-13 DIAGNOSIS — R52 Pain, unspecified: Secondary | ICD-10-CM

## 2019-10-13 DIAGNOSIS — W19XXXA Unspecified fall, initial encounter: Secondary | ICD-10-CM

## 2019-10-13 DIAGNOSIS — M25552 Pain in left hip: Secondary | ICD-10-CM

## 2019-10-13 MED ORDER — KETOROLAC TROMETHAMINE 60 MG/2ML IM SOLN
INTRAMUSCULAR | Status: AC
  Filled 2019-10-13: qty 2

## 2019-10-13 MED ORDER — KETOROLAC TROMETHAMINE 60 MG/2ML IM SOLN
60.0000 mg | Freq: Once | INTRAMUSCULAR | Status: AC
  Administered 2019-10-13: 60 mg via INTRAMUSCULAR

## 2019-10-13 NOTE — ED Notes (Addendum)
Large mass noted toleft buttocks area; medical provider notified of same. Pt states started as a "small spot when my hip was replaced."

## 2019-10-13 NOTE — Discharge Instructions (Signed)
I would like for you to follow-up with orthopedics regarding her right hip pain and falls.  Your x-ray was negative for any change in hardware in the right hip, or any bony abnormalities or fractures.  X-ray on the left side of your hip, showed the mass has calcifications, and the radiologist recommend an MRI, because they cannot rule out cancer.  Follow-up with orthopedics and general surgery with the information given above.  Also really need to establish primary care, to have labs drawn and blood pressure followed.

## 2019-10-13 NOTE — ED Provider Notes (Signed)
[facility]    CSN: [id number] Arrival date & time: 10/13/19  1155      History   Chief Complaint Chief Complaint  Patient presents with  . Fall  . Hip Pain  . Leg Pain    HPI Darren Sherman is a 64 y.o. male.   Patient reports that he has fallen, slipped at work and landed on his right hip at least 3 times in the last 6 months.  History of right hip replacement, more than once, the most recent replacement in 2000.  Denies dizziness, loss of balance during falls.  Reports that they have been something in the floor that he slipped on or tripped over.  He is concerned about the hardware in his hip, and he is having right hip pain.  He also has mass growing on his left hip.  He states that this has been here at least 3 years, he has never had it evaluated.  Denies headaches, chills, fever, shortness of breath, nausea, vomiting, diarrhea, rash, other symptoms.  ROS per HPI  The history is provided by the patient.    History reviewed. No pertinent past medical history.  Patient Active Problem List   Diagnosis Date Noted  . Unilateral primary osteoarthritis, left knee 06/28/2016    Past Surgical History:  Procedure Laterality Date  . TOTAL HIP ARTHROPLASTY         Home Medications    Prior to Admission medications   Medication Sig Start Date End Date Taking? Authorizing Provider  ibuprofen (ADVIL,MOTRIN) 200 MG tablet Take 200 mg by mouth as needed.    [provider]  meloxicam (MOBIC) 15 MG tablet Take 1 tablet (15 mg total) by mouth daily. 05/18/16   Kirichenko, Tatyana, PA-C  ondansetron (ZOFRAN ODT) 8 MG disintegrating tablet Take 1 tablet (8 mg total) by mouth every 8 (eight) hours as needed for nausea or vomiting. 08/09/13   Marisa Severin, MD    Family History Family History  Problem Relation Age of Onset  . Diabetes Father     Social History Social History   Tobacco Use  . Smoking status: Never Smoker  . Smokeless tobacco: Never Used    Substance Use Topics  . Alcohol use: No  . Drug use: No     Allergies   Patient has no known allergies.   Review of Systems Review of Systems   Physical Exam Triage Vital Signs ED Triage Vitals  Enc Vitals Group     BP 10/13/19 1300 (!) 184/114     Pulse Rate 10/13/19 1300 77     Resp 10/13/19 1300 18     Temp 10/13/19 1300 97.9 F (36.6 C)     Temp Source 10/13/19 1300 Oral     SpO2 10/13/19 1300 98 %     Weight --      Height --      Head Circumference --      Peak Flow --      Pain Score 10/13/19 1258 6     Pain Loc --      Pain Edu? --      Excl. in GC? --    No data found.  Updated Vital Signs BP (!) 183/109 (BP Location: Right Arm) Comment: re-eval  Pulse 77   Temp 97.9 F (36.6 C) (Oral)   Resp 18   SpO2 98%   Visual Acuity Right Eye Distance:   Left Eye Distance:   Bilateral Distance:    Right  Eye Near:   Left Eye Near:    Bilateral Near:     Physical Exam Vitals and nursing note reviewed.  Constitutional:      General: He is not in acute distress.    Appearance: Normal appearance. He is well-developed and normal weight.  HENT:     Head: Normocephalic and atraumatic.  Eyes:     Conjunctiva/sclera: Conjunctivae normal.  Cardiovascular:     Rate and Rhythm: Normal rate and regular rhythm.     Heart sounds: Normal heart sounds. No murmur.  Pulmonary:     Effort: Pulmonary effort is normal. No respiratory distress.     Breath sounds: Normal breath sounds. No stridor. No wheezing, rhonchi or rales.  Abdominal:     General: Bowel sounds are normal. There is no distension.     Palpations: Abdomen is soft. There is no mass.     Tenderness: There is no abdominal tenderness. There is no guarding or rebound.     Hernia: No hernia is present.  Musculoskeletal:        General: Tenderness and deformity present.     Cervical back: Normal range of motion and neck supple.  Skin:    General: Skin is warm and dry.     Capillary Refill: Capillary  refill takes less than 2 seconds.  Neurological:     General: No focal deficit present.     Mental Status: He is alert and oriented to person, place, and time.  Psychiatric:        Mood and Affect: Mood normal.        Behavior: Behavior normal.      UC Treatments / Results  Labs (all labs ordered are listed, but only abnormal results are displayed) Labs Reviewed - No data to display  EKG   Radiology DG Hip Unilat With Pelvis 2-3 Views Left  Result Date: 10/13/2019 CLINICAL DATA:  Left leg mass. EXAM: DG HIP (WITH OR WITHOUT PELVIS) 2-3V LEFT COMPARISON:  None. FINDINGS: There is no evidence of hip fracture or dislocation. There is no evidence of arthropathy or other focal bone abnormality. There does appear to be a large probably pedunculated mass arising from the proximal left thigh which contains calcifications. Further evaluation with MRI is recommended. IMPRESSION: No fracture or dislocation is noted. 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 Further evaluation with MRI is recommended to evaluate for possible neoplasm. Electronically Signed   By: Lupita Raider M.D.   On: 10/13/2019 14:07   DG Hip Unilat With Pelvis 2-3 Views Right  Result Date: 10/13/2019 CLINICAL DATA:  Right hip pain. EXAM: DG HIP (WITH OR WITHOUT PELVIS) 2-3V RIGHT COMPARISON:  None. FINDINGS: Status post right total hip arthroplasty. The femoral and acetabular components appear to be well situated. Deformity of proximal right femur is noted most consistent with old healed fracture. No acute abnormality is noted. IMPRESSION: Negative. Electronically Signed   By: Lupita Raider M.D.   On: 10/13/2019 14:04   DG Femur 1V Right  Result Date: 10/13/2019 CLINICAL DATA:  Right hip pain. EXAM: RIGHT FEMUR 1 VIEW COMPARISON:  None. FINDINGS: Status post right total hip arthroplasty. Old healed fracture is seen involving proximal right femur. No new fracture or  dislocation is noted. No soft tissue abnormality is noted. IMPRESSION: Status post right total hip arthroplasty. No acute abnormality seen in the right femur. Electronically Signed   By: Lupita Raider M.D.   On: 10/13/2019 14:05  Procedures Procedures (including critical care time)  Medications Ordered in UC Medications  ketorolac (TORADOL) injection 60 mg (60 mg Intramuscular Given 10/13/19 1320)    Initial Impression / Assessment and Plan / UC Course  I have reviewed the triage vital signs and the nursing notes.  Pertinent labs & imaging results that were available during my care of the patient were reviewed by me and considered in my medical decision making (see chart for details).     Right hip pain that is getting progressively worse over the last few months per patient.  Reports that he has not taken anything other than maybe 1 or 2 Tylenol for it in the last 6 months.  Toradol 60 mg IM given in office today.  Mildly tender over the area of the left right hip.  Right hip x-ray today shows the hardware is intact, no bony deformity.  Instructed to follow-up with orthopedics, information given for him to contact them today.  Lump to left thigh, appears to be a mass near left femoral head.  On x-ray, it shows calcifications inside the mass, radiology recommends MRI for further evaluation.  Directed to [facility] surgery for further evaluation and treatment.  Patient also had a very elevated blood pressure in office today, reports that he has never had high blood pressure before.  Instructed to establish primary care and get labs and to be followed for this blood pressure.  If he cannot get in somewhere, he may return to this office for treatment.  Follow-up with Specialist as discussed.  Patient agreeable to treatment plan.  Instructed to go to the ER for loss of sensation, loss of bowel or bladder function, other concerning symptoms. Final Clinical Impressions(s) / UC Diagnoses     Final diagnoses:  Pain  Lump of thigh, left  Fall, initial encounter  Right hip pain  Elevated blood pressure reading     Discharge Instructions     I would like for you to follow-up with orthopedics regarding her right hip pain and falls.  Your x-ray was negative for any change in hardware in the right hip, or any bony abnormalities or fractures.  X-ray on the left side of your hip, showed the mass has calcifications, and the radiologist recommend an MRI, because they cannot rule out cancer.  Follow-up with orthopedics and general surgery with the information given above.  Also really need to establish primary care, to have labs drawn and blood pressure followed.    ED Prescriptions    None     PDMP not reviewed this encounter.   Faustino Congress, NP 10/13/19 1721

## 2019-10-13 NOTE — ED Triage Notes (Signed)
Pt states he has slipped and fallen about 3 times in last 6 months. States his right leg and hip have been hurting ever since. Denies numbness to extremities. Denies injury to head with falls.  Has h/o of three hip replacements to right side; last one in 2000.

## 2019-10-21 ENCOUNTER — Ambulatory Visit (HOSPITAL_COMMUNITY)
Admission: EM | Admit: 2019-10-21 | Discharge: 2019-10-21 | Disposition: A | Discharge status: Home / Self Care | Payer: Worker's Compensation | Attending: Family Medicine | Admitting: Family Medicine

## 2019-10-21 ENCOUNTER — Other Ambulatory Visit: Payer: Self-pay

## 2019-10-21 ENCOUNTER — Encounter (HOSPITAL_COMMUNITY): Payer: Self-pay

## 2019-10-21 DIAGNOSIS — M79651 Pain in right thigh: Secondary | ICD-10-CM

## 2019-10-21 DIAGNOSIS — R030 Elevated blood-pressure reading, without diagnosis of hypertension: Secondary | ICD-10-CM

## 2019-10-21 MED ORDER — PREDNISONE 10 MG (21) PO TBPK: ORAL_TABLET | Freq: Every day | ORAL | 0 refills | Status: DC

## 2019-10-21 NOTE — ED Triage Notes (Signed)
Pt presents with recurrent right leg pain and tightness.

## 2019-10-21 NOTE — Discharge Instructions (Addendum)
Keep your follow up appointment with your orthopaedist.  Your blood pressure was noted to be elevated during your visit today. You may return here within the next few days to recheck if unable to see your primary care doctor. If your blood pressure remains persistently elevated, you may need to begin taking a medication.  BP (!) 165/92 (BP Location: Right Arm)   Pulse 72   Temp 98.3 F (36.8 C) (Oral)   Resp 17   SpO2 97%

## 2019-10-21 NOTE — ED Provider Notes (Addendum)
[place]   [id number] 10/21/19 Arrival Time: 1620  ASSESSMENT & PLAN:  1. Right thigh pain   2. Elevated blood pressure reading without diagnosis of hypertension     No indication for plain imaging at this time. Reports he has an appt with his orthopaedist on Monday of next week.  Begin trial of: Meds ordered this encounter  Medications  . predniSONE (STERAPRED UNI-PAK 21 TAB) 10 MG (21) TBPK tablet    Sig: Take by mouth daily. Take as directed.    Dispense:  21 tablet    Refill:  0     Discharge Instructions     Keep your follow up appointment with your orthopaedist.  Your blood pressure was noted to be elevated during your visit today. You may return here within the next few days to recheck if unable to see your primary care doctor. If your blood pressure remains persistently elevated, you may need to begin taking a medication.  BP (!) 165/92 (BP Location: Right Arm)   Pulse 72   Temp 98.3 F (36.8 C) (Oral)   Resp 17   SpO2 97%       Reviewed expectations re: course of current medical issues. Questions answered. Outlined signs and symptoms indicating need for more acute intervention. Patient verbalized understanding. After Visit Summary given.  SUBJECTIVE: History from: patient. Darren Sherman is a 64 y.o. male who reports on/off R thigh pain for "a really long time". "Feels tight". No injury. Seen in ED on 10/13/19; note reviewed by me. Ambulatory. Had to leave work early today secondary to leg pain. No specific radicular symptoms. No extremity sensation changes or weakness. No OTC treatment reported.   Past Surgical History:  Procedure Laterality Date  . TOTAL HIP ARTHROPLASTY     Right.  Increased blood pressure noted today. Reports that he has not been treated for hypertension in the past.  He reports no chest pain on exertion, no dyspnea on exertion, no swelling of ankles, no orthostatic dizziness or lightheadedness, no orthopnea or  paroxysmal nocturnal dyspnea, no palpitations and no intermittent claudication symptoms.  OBJECTIVE:  Vitals:   10/21/19 1650  BP: (!) 165/92  Pulse: 72  Resp: 17  Temp: 98.3 F (36.8 C)  TempSrc: Oral  SpO2: 97%    General appearance: alert; no distress HEENT: [place]; AT Neck: supple with FROM Resp: unlabored respirations Extremities: . RLE: warm with well perfused appearance; fairly well localized moderate tenderness over right anterior thigh; without gross deformities; swelling: none; bruising: none; hip and knee ROM: normal Skin: warm and dry; no visible rashes Neurologic: gait normal; normal sensation and strength of bilateral LE Psychological: alert and cooperative; normal mood and affect   No Known Allergies  History reviewed. No pertinent past medical history.   Social History   Socioeconomic History  . Marital status: Single    Spouse name: Not on file  . Number of children: Not on file  . Years of education: Not on file  . Highest education level: Not on file  Occupational History  . Not on file  Tobacco Use  . Smoking status: Never Smoker  . Smokeless tobacco: Never Used  Substance and Sexual Activity  . Alcohol use: No  . Drug use: No  . Sexual activity: Not on file  Other Topics Concern  . Not on file  Social History Narrative  . Not on file   Social Determinants of Health   Financial Resource Strain:   . Difficulty of Paying  Living Expenses:   Food Insecurity:   . Worried About Programme researcher, broadcasting/film/video in the Last Year:   . Barista in the Last Year:   Transportation Needs:   . Freight forwarder (Medical):   Marland Kitchen Lack of Transportation (Non-Medical):   Physical Activity:   . Days of Exercise per Week:   . Minutes of Exercise per Session:   Stress:   . Feeling of Stress :   Social Connections:   . Frequency of Communication with Friends and Family:   . Frequency of Social Gatherings with Friends and Family:   . Attends Religious  Services:   . Active Member of Clubs or Organizations:   . Attends Banker Meetings:   Marland Kitchen Marital Status:    Family History  Problem Relation Age of Onset  . Diabetes Father    Past Surgical History:  Procedure Laterality Date  . TOTAL HIP ARTHROPLASTY        Mardella Layman, MD 10/21/19 [id number]    Mardella Layman, MD 10/21/19 [phone]

## 2019-10-23 ENCOUNTER — Ambulatory Visit: Payer: Self-pay | Admitting: Nurse Practitioner

## 2019-10-28 ENCOUNTER — Ambulatory Visit (HOSPITAL_COMMUNITY)
Admission: RE | Admit: 2019-10-28 | Discharge: 2019-10-28 | Disposition: A | Discharge status: Home / Self Care | Payer: Medicare Other | Source: Ambulatory Visit | Attending: Nurse Practitioner | Admitting: Nurse Practitioner

## 2019-10-28 ENCOUNTER — Other Ambulatory Visit: Payer: Self-pay

## 2019-10-28 ENCOUNTER — Encounter: Payer: Self-pay | Admitting: Nurse Practitioner

## 2019-10-28 ENCOUNTER — Ambulatory Visit (INDEPENDENT_AMBULATORY_CARE_PROVIDER_SITE_OTHER): Payer: Medicare Other | Admitting: Nurse Practitioner

## 2019-10-28 VITALS — BP 168/88 | HR 110 | Temp 98.6°F | Resp 16 | Ht 66.0 in | Wt 170.0 lb

## 2019-10-28 DIAGNOSIS — G8929 Other chronic pain: Secondary | ICD-10-CM

## 2019-10-28 DIAGNOSIS — M25551 Pain in right hip: Secondary | ICD-10-CM | POA: Diagnosis not present

## 2019-10-28 DIAGNOSIS — Z0000 Encounter for general adult medical examination without abnormal findings: Secondary | ICD-10-CM

## 2019-10-28 DIAGNOSIS — M79604 Pain in right leg: Secondary | ICD-10-CM

## 2019-10-28 DIAGNOSIS — Z7689 Persons encountering health services in other specified circumstances: Secondary | ICD-10-CM | POA: Diagnosis not present

## 2019-10-28 LAB — POCT URINALYSIS DIP (CLINITEK)
Bilirubin, UA: NEGATIVE
Blood, UA: NEGATIVE
Glucose, UA: NEGATIVE mg/dL
Ketones, POC UA: NEGATIVE mg/dL
Leukocytes, UA: NEGATIVE
Nitrite, UA: NEGATIVE
POC PROTEIN,UA: NEGATIVE
Spec Grav, UA: 1.02 (ref 1.010–1.025)
Urobilinogen, UA: 0.2 E.U./dL
pH, UA: 7.5 (ref 5.0–8.0)

## 2019-10-28 NOTE — Patient Instructions (Addendum)
Hypertension, Adult Hypertension is another name for high blood pressure. High blood pressure forces your heart to work harder to pump blood. This can cause problems over time. There are two numbers in a blood pressure reading. There is a top number (systolic) over a bottom number (diastolic). It is best to have a blood pressure that is below 120/80. Healthy choices can help lower your blood pressure, or you may need medicine to help lower it. What are the causes? The cause of this condition is not known. Some conditions may be related to high blood pressure. What increases the risk?  Smoking.  Having type 2 diabetes mellitus, high cholesterol, or both.  Not getting enough exercise or physical activity.  Being overweight.  Having too much fat, sugar, calories, or salt (sodium) in your diet.  Drinking too much alcohol.  Having long-term (chronic) kidney disease.  Having a family history of high blood pressure.  Age. Risk increases with age.  Race. You may be at higher risk if you are African American.  Gender. Men are at higher risk than women before age 45. After age 65, women are at higher risk than men.  Having obstructive sleep apnea.  Stress. What are the signs or symptoms?  High blood pressure may not cause symptoms. Very high blood pressure (hypertensive crisis) may cause: ? Headache. ? Feelings of worry or nervousness (anxiety). ? Shortness of breath. ? Nosebleed. ? A feeling of being sick to your stomach (nausea). ? Throwing up (vomiting). ? Changes in how you see. ? Very bad chest pain. ? Seizures. How is this treated?  This condition is treated by making healthy lifestyle changes, such as: ? Eating healthy foods. ? Exercising more. ? Drinking less alcohol.  Your health care provider may prescribe medicine if lifestyle changes are not enough to get your blood pressure under control, and if: ? Your top number is above 130. ? Your bottom number is above  80.  Your personal target blood pressure may vary. Follow these instructions at home: Eating and drinking   If told, follow the DASH eating plan. To follow this plan: ? Fill one half of your plate at each meal with fruits and vegetables. ? Fill one fourth of your plate at each meal with whole grains. Whole grains include whole-wheat pasta, brown rice, and whole-grain bread. ? Eat or drink low-fat dairy products, such as skim milk or low-fat yogurt. ? Fill one fourth of your plate at each meal with low-fat (lean) proteins. Low-fat proteins include fish, chicken without skin, eggs, beans, and tofu. ? Avoid fatty meat, cured and processed meat, or chicken with skin. ? Avoid pre-made or processed food.  Eat less than 1,500 mg of salt each day.  Do not drink alcohol if: ? Your doctor tells you not to drink. ? You are pregnant, may be pregnant, or are planning to become pregnant.  If you drink alcohol: ? Limit how much you use to:  0-1 drink a day for women.  0-2 drinks a day for men. ? Be aware of how much alcohol is in your drink. In the [place] one drink equals one 12 oz bottle of beer (355 mL), one 5 oz glass of wine (148 mL), or one 1 oz glass of hard liquor (44 mL). Lifestyle   Work with your doctor to stay at a healthy weight or to lose weight. Ask your doctor what the best weight is for you.  Get at least 30 minutes of exercise most   days of the week. This may include walking, swimming, or biking.  Get at least 30 minutes of exercise that strengthens your muscles (resistance exercise) at least 3 days a week. This may include lifting weights or doing Pilates.  Do not use any products that contain nicotine or tobacco, such as cigarettes, e-cigarettes, and chewing tobacco. If you need help quitting, ask your doctor.  Check your blood pressure at home as told by your doctor.  Keep all follow-up visits as told by your doctor. This is important. Medicines  Take over-the-counter  and prescription medicines only as told by your doctor. Follow directions carefully.  Do not skip doses of blood pressure medicine. The medicine does not work as well if you skip doses. Skipping doses also puts you at risk for problems.  Ask your doctor about side effects or reactions to medicines that you should watch for. Contact a doctor if you:  Think you are having a reaction to the medicine you are taking.  Have headaches that keep coming back (recurring).  Feel dizzy.  Have swelling in your ankles.  Have trouble with your vision. Get help right away if you:  Get a very bad headache.  Start to feel mixed up (confused).  Feel weak or numb.  Feel faint.  Have very bad pain in your: ? Chest. ? Belly (abdomen).  Throw up more than once.  Have trouble breathing. Summary  Hypertension is another name for high blood pressure.  High blood pressure forces your heart to work harder to pump blood.  For most people, a normal blood pressure is less than 120/80.  Making healthy choices can help lower blood pressure. If your blood pressure does not get lower with healthy choices, you may need to take medicine. This information is not intended to replace advice given to you by your health care provider. Make sure you discuss any questions you have with your health care provider. Document Revised: 03/26/2018 Document Reviewed: 03/26/2018 Elsevier Patient Education  [place].   Arthritis Arthritis means joint pain. It can also mean joint disease. A joint is a place where bones come together. There are more than 100 types of arthritis. What are the causes? This condition may be caused by:  Wear and tear of a joint. This is the most common cause.  A lot of acid in the blood, which leads to pain in the joint (gout).  Pain and swelling (inflammation) in a joint.  Infection of a joint.  Injuries in the joint.  A reaction to medicines (allergy). In some cases,  the cause may not be known. What are the signs or symptoms? Symptoms of this condition include:  Redness at a joint.  Swelling at a joint.  Stiffness at a joint.  Warmth coming from the joint.  A fever.  A feeling of being sick. How is this treated? This condition may be treated with:  Treating the cause, if it is known.  Rest.  Raising (elevating) the joint.  Putting cold or hot packs on the joint.  Medicines to treat symptoms and reduce pain and swelling.  Shots of medicines (cortisone) into the joint. You may also be told to make changes in your life, such as doing exercises and losing weight. Follow these instructions at home: Medicines  Take over-the-counter and prescription medicines only as told by your doctor.  Do not take aspirin for pain if your doctor says that you may have gout. Activity  Rest your joint if your  doctor tells you to.  Avoid activities that make the pain worse.  Exercise your joint regularly as told by your doctor. Try doing exercises like: ? Swimming. ? Water aerobics. ? Biking. ? Walking. Managing pain, stiffness, and swelling      If told, put ice on the affected area. ? Put ice in a plastic bag. ? Place a towel between your skin and the bag. ? Leave the ice on for 20 minutes, 2-3 times per day.  If your joint is swollen, raise (elevate) it above the level of your heart if told by your doctor.  If your joint feels stiff in the morning, try taking a warm shower.  If told, put heat on the affected area. Do this as often as told by your doctor. Use the heat source that your doctor recommends, such as a moist heat pack or a heating pad. If you have diabetes, do not apply heat without asking your doctor. To apply heat: ? Place a towel between your skin and the heat source. ? Leave the heat on for 20-30 minutes. ? Remove the heat if your skin turns bright red. This is very important if you are unable to feel pain, heat, or cold.  You may have a greater risk of getting burned. General instructions  Do not use any products that contain nicotine or tobacco, such as cigarettes, e-cigarettes, and chewing tobacco. If you need help quitting, ask your doctor.  Keep all follow-up visits as told by your doctor. This is important. Contact a doctor if:  The pain gets worse.  You have a fever. Get help right away if:  You have very bad pain in your joint.  You have swelling in your joint.  Your joint is red.  Many joints become painful and swollen.  You have very bad back pain.  Your leg is very weak.  You cannot control your pee (urine) or poop (stool). Summary  Arthritis means joint pain. It can also mean joint disease. A joint is a place where bones come together.  The most common cause of this condition is wear and tear of a joint.  Symptoms of this condition include redness, swelling, or stiffness of the joint.  This condition is treated with rest, raising the joint, medicines, and putting cold or hot packs on the joint.  Follow your doctor's instructions about medicines, activity, exercises, and other home care treatments. This information is not intended to replace advice given to you by your health care provider. Make sure you discuss any questions you have with your health care provider. Document Revised: 06/23/2018 Document Reviewed: 06/23/2018 Elsevier Patient Education  2020 [employer].

## 2019-10-28 NOTE — Progress Notes (Signed)
New Patient Office Visit  Subjective:  Patient ID: Darren Sherman, male    DOB: 07-Jun-1956  Age: 64 y.o. MRN: [id number]  CC:  Chief Complaint  Patient presents with  . Establish Care  . Leg Pain    right leg pain     HPI Darren Sherman presents to establish care. He  has no past medical history on file.  He admits that he has not had a primary care provider since he lives in [place]. Hypertension Patient is here for evaluation of elevated blood pressures. Age at onset of elevated blood pressure: Unknown. Cardiac symptoms: none. Patient denies chest pain, dyspnea, fatigue, irregular heart beat, lower extremity edema, palpitations and syncope. Cardiovascular risk factors: advanced age (older than 57 for men, 24 for women) and male gender. Use of agents associated with hypertension: none. History of target organ damage: none. He is going to have surgery on tomorrow.  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  He admits that it has grown over time.  He is status post right hip surgery.  He continues to have a lot of pain in his right hip and leg.  This is related to several falls.  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  He rates his right hip pain 5 out of 10.  He denies any numbness tingling or burning in his leg.  He does have some weakness.  History reviewed. No pertinent past medical history.  Past Surgical History:  Procedure Laterality Date  . TOTAL HIP ARTHROPLASTY      Family History  Problem Relation Age of Onset  . Diabetes Father     Social History   Socioeconomic History  . Marital status: Single    Spouse name: Not on file  . Number of children: Not on file  . Years of education: Not on file  . Highest education level: Not on file  Occupational History  . Not on file  Tobacco Use  . Smoking status: Never Smoker  . Smokeless tobacco: Never Used  Substance and Sexual Activity  . Alcohol use: No  . Drug use: No  . Sexual activity: Not  on file  Other Topics Concern  . Not on file  Social History Narrative  . Not on file   Social Determinants of Health   Financial Resource Strain:   . Difficulty of Paying Living Expenses:   Food Insecurity:   . Worried About Programme researcher, broadcasting/film/video in the Last Year:   . Barista in the Last Year:   Transportation Needs:   . Freight forwarder (Medical):   Marland Kitchen Lack of Transportation (Non-Medical):   Physical Activity:   . Days of Exercise per Week:   . Minutes of Exercise per Session:   Stress:   . Feeling of Stress :   Social Connections:   . Frequency of Communication with Friends and Family:   . Frequency of Social Gatherings with Friends and Family:   . Attends Religious Services:   . Active Member of Clubs or Organizations:   . Attends Banker Meetings:   Marland Kitchen Marital Status:   Intimate Partner Violence:   . Fear of Current or Ex-Partner:   . Emotionally Abused:   Marland Kitchen Physically Abused:   . Sexually Abused:     ROS Review of Systems  Constitutional: Negative.   HENT: Negative.   Eyes: Negative.   Respiratory: Negative.   Cardiovascular: Negative.  Gastrointestinal: Negative.   Endocrine: Negative.   Genitourinary: Negative.   Musculoskeletal: Positive for gait problem and joint swelling.  Skin: Negative.   Allergic/Immunologic: Negative.   Hematological: Negative.   Psychiatric/Behavioral: Negative.     Objective:   Today's Vitals: BP (!) 168/88 (BP Location: Left Arm, Patient Position: Sitting, Cuff Size: Large)   Pulse (!) 110   Temp 98.6 F (37 C) (Oral)   Resp 16   Ht 5\' 6"  (1.676 m)   Wt 170 lb (77.1 kg)   SpO2 99%   BMI 27.44 kg/m   Physical Exam Constitutional:      Appearance: Normal appearance.  HENT:     Head: Normocephalic.     Nose: Nose normal.     Mouth/Throat:     Mouth: Mucous membranes are moist.  Cardiovascular:     Rate and Rhythm: Normal rate and regular rhythm.     Pulses: Normal pulses.  Pulmonary:      Effort: Pulmonary effort is normal.     Breath sounds: Normal breath sounds.  Abdominal:     General: Bowel sounds are normal.  Musculoskeletal:     Cervical back: Normal range of motion.     Right hip: Tenderness and bony tenderness present. Decreased range of motion. Decreased strength.     Left hip: No bony tenderness. Normal range of motion. Normal strength.       Legs:  Skin:    General: Skin is warm and dry.  Neurological:     General: No focal deficit present.     Mental Status: He is alert and oriented to person, place, and time.  Psychiatric:        Mood and Affect: Mood normal.        Behavior: Behavior normal.        Thought Content: Thought content normal.        Judgment: Judgment normal.     Assessment & Plan:   Problem List Items Addressed This Visit    None    Visit Diagnoses    Chronic hip pain, right    -  Primary   Continue follow-up with orthopedist   Encounter to establish care       Relevant Orders   EKG 12-Lead   Pain of right lower extremity       Lumbar spine x-ray to evaluate possible causes of increased pain   Relevant Orders   Sedimentation rate (Completed)   C-reactive protein (Completed)   DG Lumbar Spine Complete W/Bend (Completed)   Healthcare maintenance       Labs pending   Relevant Orders   CBC with Differential/Platelet (Completed)   Comp. Metabolic Panel (12) (Completed)   TSH (Completed)   Vitamin B12 (Completed)   Magnesium (Completed)   PSA (Completed)   POCT URINALYSIS DIP (CLINITEK) (Completed)      Outpatient Encounter Medications as of 10/28/2019  Medication Sig  . [DISCONTINUED] ibuprofen (ADVIL,MOTRIN) 200 MG tablet Take 200 mg by mouth as needed.  . [DISCONTINUED] meloxicam (MOBIC) 15 MG tablet Take 1 tablet (15 mg total) by mouth daily.  . [DISCONTINUED] ondansetron (ZOFRAN ODT) 8 MG disintegrating tablet Take 1 tablet (8 mg total) by mouth every 8 (eight) hours as needed for nausea or vomiting.  .  [DISCONTINUED] predniSONE (STERAPRED UNI-PAK 21 TAB) 10 MG (21) TBPK tablet Take by mouth daily. Take as directed.   No facility-administered encounter medications on file as of 10/28/2019.    Follow-up: Return in about 2  months (around 12/28/2019) for AND Have pt schedule fasting lab only apt within the next weeks thanks.   Vevelyn Francois, NP

## 2019-10-29 LAB — C-REACTIVE PROTEIN: CRP: 6 mg/L (ref 0–10)

## 2019-10-29 LAB — COMP. METABOLIC PANEL (12)
AST: 18 IU/L (ref 0–40)
Albumin/Globulin Ratio: 1.5 (ref 1.2–2.2)
Albumin: 4.4 g/dL (ref 3.8–4.8)
Alkaline Phosphatase: 81 IU/L (ref 39–117)
BUN/Creatinine Ratio: 11 (ref 10–24)
BUN: 10 mg/dL (ref 8–27)
Bilirubin Total: 0.2 mg/dL (ref 0.0–1.2)
Calcium: 9.6 mg/dL (ref 8.6–10.2)
Chloride: 103 mmol/L (ref 96–106)
Creatinine, Ser: 0.91 mg/dL (ref 0.76–1.27)
GFR calc Af Amer: 103 mL/min/{1.73_m2} (ref >59)
GFR calc non Af Amer: 89 mL/min/{1.73_m2} (ref >59)
Globulin, Total: 3 g/dL (ref 1.5–4.5)
Glucose: 95 mg/dL (ref 65–99)
Potassium: 4.3 mmol/L (ref 3.5–5.2)
Sodium: 140 mmol/L (ref 134–144)
Total Protein: 7.4 g/dL (ref 6.0–8.5)

## 2019-10-29 LAB — CBC WITH DIFFERENTIAL/PLATELET
Basophils Absolute: 0 10*3/uL (ref 0.0–0.2)
Basos: 1 %
EOS (ABSOLUTE): 0.1 10*3/uL (ref 0.0–0.4)
Eos: 2 %
Hematocrit: 46 % (ref 37.5–51.0)
Hemoglobin: 14.8 g/dL (ref 13.0–17.7)
Immature Grans (Abs): 0 10*3/uL (ref 0.0–0.1)
Immature Granulocytes: 1 %
Lymphocytes Absolute: 1.2 10*3/uL (ref 0.7–3.1)
Lymphs: 33 %
MCH: 28.8 pg (ref 26.6–33.0)
MCHC: 32.2 g/dL (ref 31.5–35.7)
MCV: 90 fL (ref 79–97)
Monocytes Absolute: 0.4 10*3/uL (ref 0.1–0.9)
Monocytes: 10 %
Neutrophils Absolute: 2 10*3/uL (ref 1.4–7.0)
Neutrophils: 53 %
Platelets: 177 10*3/uL (ref 150–450)
RBC: 5.14 x10E6/uL (ref 4.14–5.80)
RDW: 11.7 % (ref 11.6–15.4)
WBC: 3.6 10*3/uL (ref 3.4–10.8)

## 2019-10-29 LAB — VITAMIN B12: Vitamin B-12: 464 pg/mL (ref 232–1245)

## 2019-10-29 LAB — TSH: TSH: 2.77 u[IU]/mL (ref 0.450–4.500)

## 2019-10-29 LAB — MAGNESIUM: Magnesium: 2.2 mg/dL (ref 1.6–2.3)

## 2019-10-29 LAB — PSA: Prostate Specific Ag, Serum: 1.6 ng/mL (ref 0.0–4.0)

## 2019-10-29 LAB — SEDIMENTATION RATE: Sed Rate: 57 mm/hr — ABNORMAL HIGH (ref 0–30)

## 2019-11-02 ENCOUNTER — Other Ambulatory Visit: Payer: Medicare Other

## 2019-11-02 ENCOUNTER — Other Ambulatory Visit: Payer: Self-pay

## 2019-11-02 DIAGNOSIS — Z0000 Encounter for general adult medical examination without abnormal findings: Secondary | ICD-10-CM

## 2019-11-03 LAB — LIPID PANEL
Chol/HDL Ratio: 3.2 ratio (ref 0.0–5.0)
Cholesterol, Total: 199 mg/dL (ref 100–199)
HDL: 62 mg/dL (ref >39)
LDL Chol Calc (NIH): 111 mg/dL — ABNORMAL HIGH (ref 0–99)
Triglycerides: 151 mg/dL — ABNORMAL HIGH (ref 0–149)
VLDL Cholesterol Cal: 26 mg/dL (ref 5–40)

## 2019-11-11 ENCOUNTER — Telehealth: Payer: Self-pay

## 2019-11-11 NOTE — Telephone Encounter (Signed)
-----   Message from Barbette Merino, NP sent at 11/11/2019  9:09 AM EDT ----- Please make Darren Sherman aware that his cholesterol is within range.  Triglycerides are slightly elevated along with the LDL.  Encourage him just to incorporate daily exercise which will help with his blood pressure and cholesterol.  We will continue to monitor.  Repeat cholesterol in 3 months.  Thanks

## 2019-11-11 NOTE — Telephone Encounter (Signed)
Called, no answer. Left a message to call back. Thanks!  

## 2019-11-12 NOTE — Telephone Encounter (Signed)
Called, no answer. Left a message to call back. Thanks!  

## 2019-11-16 NOTE — Telephone Encounter (Signed)
Called, no answer. Left a message to call back.  

## 2019-11-16 NOTE — Telephone Encounter (Signed)
Have not been able to contact patient by phone. Will mail letter. Thanks!

## 2019-12-21 ENCOUNTER — Other Ambulatory Visit: Payer: Self-pay | Admitting: Orthopedic Surgery

## 2019-12-21 ENCOUNTER — Other Ambulatory Visit (HOSPITAL_COMMUNITY): Payer: Self-pay | Admitting: Orthopedic Surgery

## 2019-12-21 DIAGNOSIS — T8484XA Pain due to internal orthopedic prosthetic devices, implants and grafts, initial encounter: Secondary | ICD-10-CM | POA: Diagnosis not present

## 2019-12-21 DIAGNOSIS — T84030A Mechanical loosening of internal right hip prosthetic joint, initial encounter: Secondary | ICD-10-CM

## 2019-12-21 DIAGNOSIS — T8484XD Pain due to internal orthopedic prosthetic devices, implants and grafts, subsequent encounter: Secondary | ICD-10-CM | POA: Diagnosis not present

## 2019-12-21 DIAGNOSIS — M25551 Pain in right hip: Secondary | ICD-10-CM | POA: Diagnosis not present

## 2019-12-25 ENCOUNTER — Encounter (HOSPITAL_COMMUNITY)
Admission: RE | Admit: 2019-12-25 | Discharge: 2019-12-25 | Disposition: A | Discharge status: Home / Self Care | Payer: Medicare HMO | Source: Ambulatory Visit | Attending: Orthopedic Surgery | Admitting: Orthopedic Surgery

## 2019-12-25 ENCOUNTER — Other Ambulatory Visit: Payer: Self-pay

## 2019-12-25 DIAGNOSIS — M25551 Pain in right hip: Secondary | ICD-10-CM | POA: Diagnosis not present

## 2019-12-25 DIAGNOSIS — T84030A Mechanical loosening of internal right hip prosthetic joint, initial encounter: Secondary | ICD-10-CM | POA: Diagnosis not present

## 2019-12-25 DIAGNOSIS — R948 Abnormal results of function studies of other organs and systems: Secondary | ICD-10-CM | POA: Diagnosis not present

## 2019-12-25 MED ORDER — TECHNETIUM TC 99M MEDRONATE IV KIT
20.0000 | PACK | Freq: Once | INTRAVENOUS | Status: AC | PRN
  Administered 2019-12-25: 20 via INTRAVENOUS

## 2019-12-25 MED ORDER — TECHNETIUM TC 99M SESTAMIBI - CARDIOLITE: 24.8000 | Freq: Once | INTRAVENOUS | Status: DC | PRN

## 2019-12-29 DIAGNOSIS — M25551 Pain in right hip: Secondary | ICD-10-CM | POA: Diagnosis not present

## 2019-12-30 ENCOUNTER — Ambulatory Visit (INDEPENDENT_AMBULATORY_CARE_PROVIDER_SITE_OTHER): Payer: Medicare HMO | Admitting: Nurse Practitioner

## 2019-12-30 ENCOUNTER — Encounter: Payer: Self-pay | Admitting: Nurse Practitioner

## 2019-12-30 ENCOUNTER — Other Ambulatory Visit: Payer: Self-pay

## 2019-12-30 VITALS — BP 179/91 | HR 73 | Temp 97.8°F | Ht 66.0 in | Wt 171.0 lb

## 2019-12-30 DIAGNOSIS — G8929 Other chronic pain: Secondary | ICD-10-CM | POA: Diagnosis not present

## 2019-12-30 DIAGNOSIS — I10 Essential (primary) hypertension: Secondary | ICD-10-CM | POA: Diagnosis not present

## 2019-12-30 DIAGNOSIS — M25551 Pain in right hip: Secondary | ICD-10-CM | POA: Diagnosis not present

## 2019-12-30 MED ORDER — AMLODIPINE BESYLATE 5 MG PO TABS: 5.0000 mg | ORAL_TABLET | Freq: Every day | ORAL | 3 refills | Status: DC

## 2019-12-30 NOTE — Patient Instructions (Signed)
   Managing Your Hypertension Hypertension is commonly called high blood pressure. This is when the force of your blood pressing against the walls of your arteries is too strong. Arteries are blood vessels that carry blood from your heart throughout your body. Hypertension forces the heart to work harder to pump blood, and may cause the arteries to become narrow or stiff. Having untreated or uncontrolled hypertension can cause heart attack, stroke, kidney disease, and other problems. What are blood pressure readings? A blood pressure reading consists of a higher number over a lower number. Ideally, your blood pressure should be below 120/80. The first ("top") number is called the systolic pressure. It is a measure of the pressure in your arteries as your heart beats. The second ("bottom") number is called the diastolic pressure. It is a measure of the pressure in your arteries as the heart relaxes. What does my blood pressure reading mean? Blood pressure is classified into four stages. Based on your blood pressure reading, your health care provider may use the following stages to determine what type of treatment you need, if any. Systolic pressure and diastolic pressure are measured in a unit called mm Hg. Normal  Systolic pressure: below 120.  Diastolic pressure: below 80. Elevated  Systolic pressure: 120-129.  Diastolic pressure: below 80. Hypertension stage 1  Systolic pressure: 130-139.  Diastolic pressure: 80-89. Hypertension stage 2  Systolic pressure: 140 or above.  Diastolic pressure: 90 or above. What health risks are associated with hypertension? Managing your hypertension is an important responsibility. Uncontrolled hypertension can lead to:  A heart attack.  A stroke.  A weakened blood vessel (aneurysm).  Heart failure.  Kidney damage.  Eye damage.  Metabolic syndrome.  Memory and concentration problems. What changes can I make to manage my  hypertension? Hypertension can be managed by making lifestyle changes and possibly by taking medicines. Your health care provider will help you make a plan to bring your blood pressure within a normal range. Eating and drinking   Eat a diet that is high in fiber and potassium, and low in salt (sodium), added sugar, and fat. An example eating plan is called the DASH (Dietary Approaches to Stop Hypertension) diet. To eat this way: ? Eat plenty of fresh fruits and vegetables. Try to fill half of your plate at each meal with fruits and vegetables. ? Eat whole grains, such as whole wheat pasta, brown rice, or whole grain bread. Fill about one quarter of your plate with whole grains. ? Eat low-fat diary products. ? Avoid fatty cuts of meat, processed or cured meats, and poultry with skin. Fill about one quarter of your plate with lean proteins such as fish, chicken without skin, beans, eggs, and tofu. ? Avoid premade and processed foods. These tend to be higher in sodium, added sugar, and fat.  Reduce your daily sodium intake. Most people with hypertension should eat less than 1,500 mg of sodium a day.  Limit alcohol intake to no more than 1 drink a day for nonpregnant women and 2 drinks a day for men. One drink equals 12 oz of beer, 5 oz of wine, or 1 oz of hard liquor. Lifestyle  Work with your health care provider to maintain a healthy body weight, or to lose weight. Ask what an ideal weight is for you.  Get at least 30 minutes of exercise that causes your heart to beat faster (aerobic exercise) most days of the week. Activities may include walking, swimming, or biking.    Include exercise to strengthen your muscles (resistance exercise), such as weight lifting, as part of your weekly exercise routine. Try to do these types of exercises for 30 minutes at least 3 days a week.  Do not use any products that contain nicotine or tobacco, such as cigarettes and e-cigarettes. If you need help quitting,  ask your health care provider.  Control any long-term (chronic) conditions you have, such as high cholesterol or diabetes. Monitoring  Monitor your blood pressure at home as told by your health care provider. Your personal target blood pressure may vary depending on your medical conditions, your age, and other factors.  Have your blood pressure checked regularly, as often as told by your health care provider. Working with your health care provider  Review all the medicines you take with your health care provider because there may be side effects or interactions.  Talk with your health care provider about your diet, exercise habits, and other lifestyle factors that may be contributing to hypertension.  Visit your health care provider regularly. Your health care provider can help you create and adjust your plan for managing hypertension. Will I need medicine to control my blood pressure? Your health care provider may prescribe medicine if lifestyle changes are not enough to get your blood pressure under control, and if:  Your systolic blood pressure is 130 or higher.  Your diastolic blood pressure is 80 or higher. Take medicines only as told by your health care provider. Follow the directions carefully. Blood pressure medicines must be taken as prescribed. The medicine does not work as well when you skip doses. Skipping doses also puts you at risk for problems. Contact a health care provider if:  You think you are having a reaction to medicines you have taken.  You have repeated (recurrent) headaches.  You feel dizzy.  You have swelling in your ankles.  You have trouble with your vision. Get help right away if:  You develop a severe headache or confusion.  You have unusual weakness or numbness, or you feel faint.  You have severe pain in your chest or abdomen.  You vomit repeatedly.  You have trouble breathing. Summary  Hypertension is when the force of blood pumping  through your arteries is too strong. If this condition is not controlled, it may put you at risk for serious complications.  Your personal target blood pressure may vary depending on your medical conditions, your age, and other factors. For most people, a normal blood pressure is less than 120/80.  Hypertension is managed by lifestyle changes, medicines, or both. Lifestyle changes include weight loss, eating a healthy, low-sodium diet, exercising more, and limiting alcohol. This information is not intended to replace advice given to you by your health care provider. Make sure you discuss any questions you have with your health care provider. Document Revised: 11/07/2018 Document Reviewed: 06/13/2016 Elsevier Patient Education  2020 [employer] Inc.  

## 2019-12-30 NOTE — Progress Notes (Signed)
[facility] Patient [facility] [address] [place], [place]  [address] Phone:  [phone]   Fax:  [phone]    Established Patient Office Visit  Subjective:  Patient ID: Darren Sherman, male    DOB: 09/18/1955  Age: 64 y.o. MRN: [id number]  CC:  Chief Complaint  Patient presents with  . Follow-up    follow up for hip , going back [facility] to discuss hip sugery     HPI Darren Sherman presents for follow up. He  has no past medical history on file.   Hip Pain Patient complains of bilateral the right being greater than left hip pain. Onset of the symptoms was several months ago. Inciting event: fell while Walking. The patient reports the hip pain is worse with weight bearing, is aggravated by walking and is worse after period of inactivity. Associated symptoms:weakness and decreased ROM.  Aggravating symptoms include: any weight bearing, going up and down stairs and inactivity. Patient has had prior hip problems. Previous visits for this problem: multiple, this is a longstanding diagnosis. Last seen 3 months ago by me. He was also evaluated by surgeon.  Evaluation to date: bone scan, which was abnormal  infection or loosening of hardware.  He is status post right hip replacement 1992 by a surgeon at [facility].  Treatment to date: OTC analgesics, which have been not very effective. He is very discouraged about his hip. He is not resting.    Hypertension Patient is here for follow-up of elevated blood pressure. He is not exercising due to his increased hip pain and falls risk and is adherent to a low-salt diet. Blood pressure is not monitored at home. Cardiac symptoms: Occasional lower extremity edema. Patient denies chest pain, dyspnea, irregular heart beat and palpitations. Cardiovascular risk factors: advanced age (older than 47 for men, 40 for women) and male gender. Use of agents associated with hypertension: none. History of target organ damage: none.  History  reviewed. No pertinent past medical history.  Past Surgical History:  Procedure Laterality Date  . MASS EXCISION  09/2019  . TOTAL HIP ARTHROPLASTY      Family History  Problem Relation Age of Onset  . Diabetes Father     Social History   Socioeconomic History  . Marital status: Single    Spouse name: Not on file  . Number of children: Not on file  . Years of education: Not on file  . Highest education level: Not on file  Occupational History  . Not on file  Tobacco Use  . Smoking status: Never Smoker  . Smokeless tobacco: Never Used  Substance and Sexual Activity  . Alcohol use: No  . Drug use: No  . Sexual activity: Not on file  Other Topics Concern  . Not on file  Social History Narrative  . Not on file   Social Determinants of Health   Financial Resource Strain:   . Difficulty of Paying Living Expenses:   Food Insecurity:   . Worried About Programme researcher, broadcasting/film/video in the Last Year:   . Barista in the Last Year:   Transportation Needs:   . Freight forwarder (Medical):   Marland Kitchen Lack of Transportation (Non-Medical):   Physical Activity:   . Days of Exercise per Week:   . Minutes of Exercise per Session:   Stress:   . Feeling of Stress :   Social Connections:   . Frequency of Communication with Friends and Family:   .  Frequency of Social Gatherings with Friends and Family:   . Attends Religious Services:   . Active Member of Clubs or Organizations:   . Attends Banker Meetings:   Marland Kitchen Marital Status:   Intimate Partner Violence:   . Fear of Current or Ex-Partner:   . Emotionally Abused:   Marland Kitchen Physically Abused:   . Sexually Abused:     No outpatient medications prior to visit.   Facility-Administered Medications Prior to Visit  Medication Dose Route Frequency Provider Last Rate Last Admin  . technetium sestamibi (CARDIOLITE) injection 24.8 millicurie  24.8 millicurie Intravenous Once PRN Poff, Randell Patient, MD        No Known  Allergies  ROS Review of Systems  All other systems reviewed and are negative.     Objective:    Physical Exam  Constitutional: He is oriented to person, place, and time. He appears well-developed and well-nourished. He appears distressed.  HENT:  Head: Normocephalic.  Eyes: Pupils are equal, round, and reactive to light.  Cardiovascular: Normal rate, regular rhythm, normal heart sounds and intact distal pulses.  Pulmonary/Chest: Effort normal and breath sounds normal. No respiratory distress.  Abdominal: Soft. Bowel sounds are normal.  Musculoskeletal:     Cervical back: Normal range of motion.     Right hip: Decreased range of motion. Decreased strength.     Left hip: Decreased strength.     Comments: Cane in use with abnormal gait, walks with a limp   Neurological: He is alert and oriented to person, place, and time.  Skin: Skin is warm and dry.  Psychiatric: He has a normal mood and affect. His behavior is normal. Judgment and thought content normal.    BP (!) 179/91 (BP Location: Left Arm, Patient Position: Sitting)   Pulse 73   Temp 97.8 F (36.6 C) (Temporal)   Ht 5\' 6"  (1.676 m)   Wt 171 lb (77.6 kg)   SpO2 100%   BMI 27.60 kg/m  Wt Readings from Last 3 Encounters:  12/30/19 171 lb (77.6 kg)  10/28/19 170 lb (77.1 kg)  10/25/16 160 lb (72.6 kg)     Health Maintenance Due  Topic Date Due  . Hepatitis C Screening  Never done  . HIV Screening  Never done  . COLONOSCOPY  Never done    There are no preventive care reminders to display for this patient.  Lab Results  Component Value Date   TSH 2.770 10/28/2019   Lab Results  Component Value Date   WBC 3.6 10/28/2019   HGB 14.8 10/28/2019   HCT 46.0 10/28/2019   MCV 90 10/28/2019   PLT 177 10/28/2019   Lab Results  Component Value Date   NA 140 10/28/2019   K 4.3 10/28/2019   CO2 24 08/09/2013   GLUCOSE 95 10/28/2019   BUN 10 10/28/2019   CREATININE 0.91 10/28/2019   BILITOT 0.2 10/28/2019    ALKPHOS 81 10/28/2019   AST 18 10/28/2019   ALT 17 08/09/2013   PROT 7.4 10/28/2019   ALBUMIN 4.4 10/28/2019   CALCIUM 9.6 10/28/2019   Lab Results  Component Value Date   CHOL 199 11/02/2019   Lab Results  Component Value Date   HDL 62 11/02/2019   Lab Results  Component Value Date   LDLCALC 111 (H) 11/02/2019   Lab Results  Component Value Date   TRIG 151 (H) 11/02/2019   Lab Results  Component Value Date   CHOLHDL 3.2 11/02/2019   No  results found for: HGBA1C    Assessment & Plan:   Problem List Items Addressed This Visit    None    Visit Diagnoses    Essential hypertension    -  Primary   Will start Norvasc 5 mg daily follow-up in 6 weeks Labs pending   Relevant Medications   amLODipine (NORVASC) 5 MG tablet   Other Relevant Orders   CBC with Differential/Platelet   Comp. Metabolic Panel (12)   Chronic hip pain, right       Patient declined any medication at this time Patient to call [facility] for follow-up appointment referral may be needed due to the length       Meds ordered this encounter  Medications  . amLODipine (NORVASC) 5 MG tablet    Sig: Take 1 tablet (5 mg total) by mouth daily.    Dispense:  90 tablet    Refill:  3    Order Specific Question:   Supervising Provider    Answer:   Tresa Garter [id number]    Follow-up: Return in about 6 weeks (around 02/10/2020).    Vevelyn Francois, NP

## 2019-12-31 LAB — CBC WITH DIFFERENTIAL/PLATELET
Basophils Absolute: 0 10*3/uL (ref 0.0–0.2)
Basos: 1 %
EOS (ABSOLUTE): 0.1 10*3/uL (ref 0.0–0.4)
Eos: 2 %
Hematocrit: 45.1 % (ref 37.5–51.0)
Hemoglobin: 15.2 g/dL (ref 13.0–17.7)
Immature Grans (Abs): 0 10*3/uL (ref 0.0–0.1)
Immature Granulocytes: 1 %
Lymphocytes Absolute: 1.5 10*3/uL (ref 0.7–3.1)
Lymphs: 37 %
MCH: 29.7 pg (ref 26.6–33.0)
MCHC: 33.7 g/dL (ref 31.5–35.7)
MCV: 88 fL (ref 79–97)
Monocytes Absolute: 0.3 10*3/uL (ref 0.1–0.9)
Monocytes: 8 %
Neutrophils Absolute: 2.2 10*3/uL (ref 1.4–7.0)
Neutrophils: 51 %
Platelets: 198 10*3/uL (ref 150–450)
RBC: 5.11 x10E6/uL (ref 4.14–5.80)
RDW: 12 % (ref 11.6–15.4)
WBC: 4.1 10*3/uL (ref 3.4–10.8)

## 2019-12-31 LAB — COMP. METABOLIC PANEL (12)
AST: 16 IU/L (ref 0–40)
Albumin/Globulin Ratio: 1.6 (ref 1.2–2.2)
Albumin: 4.7 g/dL (ref 3.8–4.8)
Alkaline Phosphatase: 84 IU/L (ref 48–121)
BUN/Creatinine Ratio: 10 (ref 10–24)
BUN: 9 mg/dL (ref 8–27)
Bilirubin Total: 0.3 mg/dL (ref 0.0–1.2)
Calcium: 9.6 mg/dL (ref 8.6–10.2)
Chloride: 102 mmol/L (ref 96–106)
Creatinine, Ser: 0.89 mg/dL (ref 0.76–1.27)
GFR calc Af Amer: 104 mL/min/{1.73_m2} (ref >59)
GFR calc non Af Amer: 90 mL/min/{1.73_m2} (ref >59)
Globulin, Total: 2.9 g/dL (ref 1.5–4.5)
Glucose: 84 mg/dL (ref 65–99)
Potassium: 4.1 mmol/L (ref 3.5–5.2)
Sodium: 142 mmol/L (ref 134–144)
Total Protein: 7.6 g/dL (ref 6.0–8.5)

## 2020-02-04 ENCOUNTER — Ambulatory Visit: Payer: Self-pay | Admitting: Nurse Practitioner

## 2020-02-09 DIAGNOSIS — Z20822 Contact with and (suspected) exposure to covid-19: Secondary | ICD-10-CM | POA: Diagnosis not present

## 2020-02-11 ENCOUNTER — Ambulatory Visit (INDEPENDENT_AMBULATORY_CARE_PROVIDER_SITE_OTHER): Payer: Medicare HMO | Admitting: Nurse Practitioner

## 2020-02-11 ENCOUNTER — Encounter: Payer: Self-pay | Admitting: Nurse Practitioner

## 2020-02-11 ENCOUNTER — Other Ambulatory Visit: Payer: Self-pay

## 2020-02-11 VITALS — BP 164/90 | HR 59 | Temp 97.5°F | Resp 16 | Ht 66.0 in | Wt 171.6 lb

## 2020-02-11 DIAGNOSIS — G8929 Other chronic pain: Secondary | ICD-10-CM

## 2020-02-11 DIAGNOSIS — I10 Essential (primary) hypertension: Secondary | ICD-10-CM | POA: Diagnosis not present

## 2020-02-11 DIAGNOSIS — M25551 Pain in right hip: Secondary | ICD-10-CM

## 2020-02-11 MED ORDER — AMLODIPINE BESYLATE 5 MG PO TABS: 5.0000 mg | ORAL_TABLET | Freq: Every day | ORAL | 3 refills | Status: DC

## 2020-02-11 NOTE — Patient Instructions (Signed)
   Managing Your Hypertension Hypertension is commonly called high blood pressure. This is when the force of your blood pressing against the walls of your arteries is too strong. Arteries are blood vessels that carry blood from your heart throughout your body. Hypertension forces the heart to work harder to pump blood, and may cause the arteries to become narrow or stiff. Having untreated or uncontrolled hypertension can cause heart attack, stroke, kidney disease, and other problems. What are blood pressure readings? A blood pressure reading consists of a higher number over a lower number. Ideally, your blood pressure should be below 120/80. The first ("top") number is called the systolic pressure. It is a measure of the pressure in your arteries as your heart beats. The second ("bottom") number is called the diastolic pressure. It is a measure of the pressure in your arteries as the heart relaxes. What does my blood pressure reading mean? Blood pressure is classified into four stages. Based on your blood pressure reading, your health care provider may use the following stages to determine what type of treatment you need, if any. Systolic pressure and diastolic pressure are measured in a unit called mm Hg. Normal  Systolic pressure: below 120.  Diastolic pressure: below 80. Elevated  Systolic pressure: 120-129.  Diastolic pressure: below 80. Hypertension stage 1  Systolic pressure: 130-139.  Diastolic pressure: 80-89. Hypertension stage 2  Systolic pressure: 140 or above.  Diastolic pressure: 90 or above. What health risks are associated with hypertension? Managing your hypertension is an important responsibility. Uncontrolled hypertension can lead to:  A heart attack.  A stroke.  A weakened blood vessel (aneurysm).  Heart failure.  Kidney damage.  Eye damage.  Metabolic syndrome.  Memory and concentration problems. What changes can I make to manage my  hypertension? Hypertension can be managed by making lifestyle changes and possibly by taking medicines. Your health care provider will help you make a plan to bring your blood pressure within a normal range. Eating and drinking   Eat a diet that is high in fiber and potassium, and low in salt (sodium), added sugar, and fat. An example eating plan is called the DASH (Dietary Approaches to Stop Hypertension) diet. To eat this way: ? Eat plenty of fresh fruits and vegetables. Try to fill half of your plate at each meal with fruits and vegetables. ? Eat whole grains, such as whole wheat pasta, brown rice, or whole grain bread. Fill about one quarter of your plate with whole grains. ? Eat low-fat diary products. ? Avoid fatty cuts of meat, processed or cured meats, and poultry with skin. Fill about one quarter of your plate with lean proteins such as fish, chicken without skin, beans, eggs, and tofu. ? Avoid premade and processed foods. These tend to be higher in sodium, added sugar, and fat.  Reduce your daily sodium intake. Most people with hypertension should eat less than 1,500 mg of sodium a day.  Limit alcohol intake to no more than 1 drink a day for nonpregnant women and 2 drinks a day for men. One drink equals 12 oz of beer, 5 oz of wine, or 1 oz of hard liquor. Lifestyle  Work with your health care provider to maintain a healthy body weight, or to lose weight. Ask what an ideal weight is for you.  Get at least 30 minutes of exercise that causes your heart to beat faster (aerobic exercise) most days of the week. Activities may include walking, swimming, or biking.    Include exercise to strengthen your muscles (resistance exercise), such as weight lifting, as part of your weekly exercise routine. Try to do these types of exercises for 30 minutes at least 3 days a week.  Do not use any products that contain nicotine or tobacco, such as cigarettes and e-cigarettes. If you need help quitting,  ask your health care provider.  Control any long-term (chronic) conditions you have, such as high cholesterol or diabetes. Monitoring  Monitor your blood pressure at home as told by your health care provider. Your personal target blood pressure may vary depending on your medical conditions, your age, and other factors.  Have your blood pressure checked regularly, as often as told by your health care provider. Working with your health care provider  Review all the medicines you take with your health care provider because there may be side effects or interactions.  Talk with your health care provider about your diet, exercise habits, and other lifestyle factors that may be contributing to hypertension.  Visit your health care provider regularly. Your health care provider can help you create and adjust your plan for managing hypertension. Will I need medicine to control my blood pressure? Your health care provider may prescribe medicine if lifestyle changes are not enough to get your blood pressure under control, and if:  Your systolic blood pressure is 130 or higher.  Your diastolic blood pressure is 80 or higher. Take medicines only as told by your health care provider. Follow the directions carefully. Blood pressure medicines must be taken as prescribed. The medicine does not work as well when you skip doses. Skipping doses also puts you at risk for problems. Contact a health care provider if:  You think you are having a reaction to medicines you have taken.  You have repeated (recurrent) headaches.  You feel dizzy.  You have swelling in your ankles.  You have trouble with your vision. Get help right away if:  You develop a severe headache or confusion.  You have unusual weakness or numbness, or you feel faint.  You have severe pain in your chest or abdomen.  You vomit repeatedly.  You have trouble breathing. Summary  Hypertension is when the force of blood pumping  through your arteries is too strong. If this condition is not controlled, it may put you at risk for serious complications.  Your personal target blood pressure may vary depending on your medical conditions, your age, and other factors. For most people, a normal blood pressure is less than 120/80.  Hypertension is managed by lifestyle changes, medicines, or both. Lifestyle changes include weight loss, eating a healthy, low-sodium diet, exercising more, and limiting alcohol. This information is not intended to replace advice given to you by your health care provider. Make sure you discuss any questions you have with your health care provider. Document Revised: 11/07/2018 Document Reviewed: 06/13/2016 Elsevier Patient Education  2020 [employer] Inc.  

## 2020-02-11 NOTE — Progress Notes (Signed)
[facility] Patient [facility] [address] [place], [place]  [address] Phone:  [phone]   Fax:  [phone]   Established Patient Office Visit  Subjective:  Patient ID: Darren Sherman, male    DOB: Feb 20, 1956  Age: 64 y.o. MRN: [id number]  CC:  Chief Complaint  Patient presents with  . Follow-up    Pt states he is here for a f/u on his BP. Pt states  hip is not getting better. Pt states his hip locks up and it's hard when he wakes up in the morning to move around. Pt states other than his hip hurting.     HPI Darren Sherman presents for follow up. He has HTN.   Hypertension Patient is here for follow-up of elevated blood pressure. He is not exercising;due to his chronic hip pain SP hip replacement for which he is being worked up for. He is adherent to a low-salt diet. Blood pressure is not monitored at home. Cardiac symptoms: none. Patient denies chest pain, dyspnea, fatigue, irregular heart beat, lower extremity edema, palpitations and syncope. Cardiovascular risk factors: none. Use of agents associated with hypertension: none. History of target organ damage: none. He has not been taking medication as directed because he did not get a call from the pharmacy.     No past medical history on file.  Past Surgical History:  Procedure Laterality Date  . MASS EXCISION  09/2019  . TOTAL HIP ARTHROPLASTY      Family History  Problem Relation Age of Onset  . Diabetes Father     Social History   Socioeconomic History  . Marital status: Single    Spouse name: Not on file  . Number of children: Not on file  . Years of education: Not on file  . Highest education level: Not on file  Occupational History  . Not on file  Tobacco Use  . Smoking status: Never Smoker  . Smokeless tobacco: Never Used  Vaping Use  . Vaping Use: Never used  Substance and Sexual Activity  . Alcohol use: No  . Drug use: No  . Sexual activity: Not on file  Other Topics Concern  . Not on file    Social History Narrative  . Not on file   Social Determinants of Health   Financial Resource Strain:   . Difficulty of Paying Living Expenses:   Food Insecurity:   . Worried About Programme researcher, broadcasting/film/video in the Last Year:   . Barista in the Last Year:   Transportation Needs:   . Freight forwarder (Medical):   Marland Kitchen Lack of Transportation (Non-Medical):   Physical Activity:   . Days of Exercise per Week:   . Minutes of Exercise per Session:   Stress:   . Feeling of Stress :   Social Connections:   . Frequency of Communication with Friends and Family:   . Frequency of Social Gatherings with Friends and Family:   . Attends Religious Services:   . Active Member of Clubs or Organizations:   . Attends Banker Meetings:   Marland Kitchen Marital Status:   Intimate Partner Violence:   . Fear of Current or Ex-Partner:   . Emotionally Abused:   Marland Kitchen Physically Abused:   . Sexually Abused:     Outpatient Medications Prior to Visit  Medication Sig Dispense Refill  . amLODipine (NORVASC) 5 MG tablet Take 1 tablet (5 mg total) by mouth daily. (Patient not taking: Reported on 02/11/2020)  90 tablet 3   No facility-administered medications prior to visit.    No Known Allergies  ROS Review of Systems  All other systems reviewed and are negative.     Objective:    Physical Exam Constitutional:      General: He is not in acute distress. HENT:     Head: Normocephalic and atraumatic.     Right Ear: Tympanic membrane normal.     Left Ear: Tympanic membrane normal.     Nose: Nose normal.     Mouth/Throat:     Mouth: Mucous membranes are moist.  Cardiovascular:     Rate and Rhythm: Normal rate and regular rhythm.     Pulses: Normal pulses.     Heart sounds: Normal heart sounds.  Musculoskeletal:     Cervical back: Normal range of motion.  Neurological:     Mental Status: He is alert.     BP (!) 164/90 (BP Location: Left Arm, Patient Position: Sitting, Cuff Size: Normal)    Pulse (!) 59   Temp (!) 97.5 F (36.4 C)   Resp 16   Ht 5\' 6"  (1.676 m)   Wt 171 lb 9.6 oz (77.8 kg)   SpO2 98%   BMI 27.70 kg/m  Wt Readings from Last 3 Encounters:  02/11/20 171 lb 9.6 oz (77.8 kg)  12/30/19 171 lb (77.6 kg)  10/28/19 170 lb (77.1 kg)     Health Maintenance Due  Topic Date Due  . Hepatitis C Screening  Never done  . HIV Screening  Never done  . COLONOSCOPY  Never done    There are no preventive care reminders to display for this patient.  Lab Results  Component Value Date   TSH 2.770 10/28/2019   Lab Results  Component Value Date   WBC 4.1 12/30/2019   HGB 15.2 12/30/2019   HCT 45.1 12/30/2019   MCV 88 12/30/2019   PLT 198 12/30/2019   Lab Results  Component Value Date   NA 142 12/30/2019   K 4.1 12/30/2019   CO2 24 08/09/2013   GLUCOSE 84 12/30/2019   BUN 9 12/30/2019   CREATININE 0.89 12/30/2019   BILITOT 0.3 12/30/2019   ALKPHOS 84 12/30/2019   AST 16 12/30/2019   ALT 17 08/09/2013   PROT 7.6 12/30/2019   ALBUMIN 4.7 12/30/2019   CALCIUM 9.6 12/30/2019   Lab Results  Component Value Date   CHOL 199 11/02/2019   Lab Results  Component Value Date   HDL 62 11/02/2019   Lab Results  Component Value Date   LDLCALC 111 (H) 11/02/2019   Lab Results  Component Value Date   TRIG 151 (H) 11/02/2019   Lab Results  Component Value Date   CHOLHDL 3.2 11/02/2019   No results found for: HGBA1C    Assessment & Plan:   Problem List Items Addressed This Visit    None    Visit Diagnoses    Essential hypertension    -  Primary   Relevant Medications   amLODipine (NORVASC) 5 MG tablet Start medication and continue with lifestyle modifications   Other Relevant Orders   Urinalysis Dipstick   Chronic hip pain, right   Follow up with [facility] as scheduled       Meds ordered this encounter  Medications  . amLODipine (NORVASC) 5 MG tablet    Sig: Take 1 tablet (5 mg total) by mouth daily.    Dispense:  90 tablet     Refill:  3    Order Specific Question:   Supervising Provider    Answer:   Quentin Angst [[id number]]    Follow-up: Return in about 6 weeks (around 03/24/2020).    Barbette Merino, NP

## 2020-02-15 DIAGNOSIS — M1611 Unilateral primary osteoarthritis, right hip: Secondary | ICD-10-CM | POA: Insufficient documentation

## 2020-02-15 DIAGNOSIS — Z96641 Presence of right artificial hip joint: Secondary | ICD-10-CM | POA: Insufficient documentation

## 2020-02-17 DIAGNOSIS — Z96649 Presence of unspecified artificial hip joint: Secondary | ICD-10-CM | POA: Diagnosis not present

## 2020-02-17 DIAGNOSIS — Z96641 Presence of right artificial hip joint: Secondary | ICD-10-CM | POA: Diagnosis not present

## 2020-02-17 DIAGNOSIS — M1611 Unilateral primary osteoarthritis, right hip: Secondary | ICD-10-CM | POA: Diagnosis not present

## 2020-03-22 DIAGNOSIS — M25551 Pain in right hip: Secondary | ICD-10-CM | POA: Diagnosis not present

## 2020-03-22 DIAGNOSIS — Z96641 Presence of right artificial hip joint: Secondary | ICD-10-CM | POA: Diagnosis not present

## 2020-03-24 ENCOUNTER — Other Ambulatory Visit: Payer: Self-pay

## 2020-03-24 ENCOUNTER — Encounter: Payer: Self-pay | Admitting: Nurse Practitioner

## 2020-03-24 ENCOUNTER — Ambulatory Visit (INDEPENDENT_AMBULATORY_CARE_PROVIDER_SITE_OTHER): Payer: Medicare HMO | Admitting: Nurse Practitioner

## 2020-03-24 VITALS — BP 129/74 | HR 63 | Temp 97.9°F | Resp 16 | Ht 66.0 in | Wt 176.6 lb

## 2020-03-24 DIAGNOSIS — Z1212 Encounter for screening for malignant neoplasm of rectum: Secondary | ICD-10-CM

## 2020-03-24 DIAGNOSIS — I10 Essential (primary) hypertension: Secondary | ICD-10-CM

## 2020-03-24 DIAGNOSIS — M25551 Pain in right hip: Secondary | ICD-10-CM

## 2020-03-24 DIAGNOSIS — G8929 Other chronic pain: Secondary | ICD-10-CM | POA: Diagnosis not present

## 2020-03-24 DIAGNOSIS — Z1211 Encounter for screening for malignant neoplasm of colon: Secondary | ICD-10-CM

## 2020-03-24 LAB — POCT URINALYSIS DIPSTICK
Bilirubin, UA: NEGATIVE
Blood, UA: NEGATIVE
Glucose, UA: NEGATIVE
Ketones, UA: NEGATIVE
Leukocytes, UA: NEGATIVE
Nitrite, UA: NEGATIVE
Protein, UA: NEGATIVE
Spec Grav, UA: >=1.03 — AB (ref 1.010–1.025)
Urobilinogen, UA: 2 E.U./dL — AB
pH, UA: 6 (ref 5.0–8.0)
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
## 2020-03-24 NOTE — Patient Instructions (Signed)
   Managing Your Hypertension Hypertension is commonly called high blood pressure. This is when the force of your blood pressing against the walls of your arteries is too strong. Arteries are blood vessels that carry blood from your heart throughout your body. Hypertension forces the heart to work harder to pump blood, and may cause the arteries to become narrow or stiff. Having untreated or uncontrolled hypertension can cause heart attack, stroke, kidney disease, and other problems. What are blood pressure readings? A blood pressure reading consists of a higher number over a lower number. Ideally, your blood pressure should be below 120/80. The first ("top") number is called the systolic pressure. It is a measure of the pressure in your arteries as your heart beats. The second ("bottom") number is called the diastolic pressure. It is a measure of the pressure in your arteries as the heart relaxes. What does my blood pressure reading mean? Blood pressure is classified into four stages. Based on your blood pressure reading, your health care provider may use the following stages to determine what type of treatment you need, if any. Systolic pressure and diastolic pressure are measured in a unit called mm Hg. Normal  Systolic pressure: below 120.  Diastolic pressure: below 80. Elevated  Systolic pressure: 120-129.  Diastolic pressure: below 80. Hypertension stage 1  Systolic pressure: 130-139.  Diastolic pressure: 80-89. Hypertension stage 2  Systolic pressure: 140 or above.  Diastolic pressure: 90 or above. What health risks are associated with hypertension? Managing your hypertension is an important responsibility. Uncontrolled hypertension can lead to:  A heart attack.  A stroke.  A weakened blood vessel (aneurysm).  Heart failure.  Kidney damage.  Eye damage.  Metabolic syndrome.  Memory and concentration problems. What changes can I make to manage my  hypertension? Hypertension can be managed by making lifestyle changes and possibly by taking medicines. Your health care provider will help you make a plan to bring your blood pressure within a normal range. Eating and drinking   Eat a diet that is high in fiber and potassium, and low in salt (sodium), added sugar, and fat. An example eating plan is called the DASH (Dietary Approaches to Stop Hypertension) diet. To eat this way: ? Eat plenty of fresh fruits and vegetables. Try to fill half of your plate at each meal with fruits and vegetables. ? Eat whole grains, such as whole wheat pasta, brown rice, or whole grain bread. Fill about one quarter of your plate with whole grains. ? Eat low-fat diary products. ? Avoid fatty cuts of meat, processed or cured meats, and poultry with skin. Fill about one quarter of your plate with lean proteins such as fish, chicken without skin, beans, eggs, and tofu. ? Avoid premade and processed foods. These tend to be higher in sodium, added sugar, and fat.  Reduce your daily sodium intake. Most people with hypertension should eat less than 1,500 mg of sodium a day.  Limit alcohol intake to no more than 1 drink a day for nonpregnant women and 2 drinks a day for men. One drink equals 12 oz of beer, 5 oz of wine, or 1 oz of hard liquor. Lifestyle  Work with your health care provider to maintain a healthy body weight, or to lose weight. Ask what an ideal weight is for you.  Get at least 30 minutes of exercise that causes your heart to beat faster (aerobic exercise) most days of the week. Activities may include walking, swimming, or biking.    Include exercise to strengthen your muscles (resistance exercise), such as weight lifting, as part of your weekly exercise routine. Try to do these types of exercises for 30 minutes at least 3 days a week.  Do not use any products that contain nicotine or tobacco, such as cigarettes and e-cigarettes. If you need help quitting,  ask your health care provider.  Control any long-term (chronic) conditions you have, such as high cholesterol or diabetes. Monitoring  Monitor your blood pressure at home as told by your health care provider. Your personal target blood pressure may vary depending on your medical conditions, your age, and other factors.  Have your blood pressure checked regularly, as often as told by your health care provider. Working with your health care provider  Review all the medicines you take with your health care provider because there may be side effects or interactions.  Talk with your health care provider about your diet, exercise habits, and other lifestyle factors that may be contributing to hypertension.  Visit your health care provider regularly. Your health care provider can help you create and adjust your plan for managing hypertension. Will I need medicine to control my blood pressure? Your health care provider may prescribe medicine if lifestyle changes are not enough to get your blood pressure under control, and if:  Your systolic blood pressure is 130 or higher.  Your diastolic blood pressure is 80 or higher. Take medicines only as told by your health care provider. Follow the directions carefully. Blood pressure medicines must be taken as prescribed. The medicine does not work as well when you skip doses. Skipping doses also puts you at risk for problems. Contact a health care provider if:  You think you are having a reaction to medicines you have taken.  You have repeated (recurrent) headaches.  You feel dizzy.  You have swelling in your ankles.  You have trouble with your vision. Get help right away if:  You develop a severe headache or confusion.  You have unusual weakness or numbness, or you feel faint.  You have severe pain in your chest or abdomen.  You vomit repeatedly.  You have trouble breathing. Summary  Hypertension is when the force of blood pumping  through your arteries is too strong. If this condition is not controlled, it may put you at risk for serious complications.  Your personal target blood pressure may vary depending on your medical conditions, your age, and other factors. For most people, a normal blood pressure is less than 120/80.  Hypertension is managed by lifestyle changes, medicines, or both. Lifestyle changes include weight loss, eating a healthy, low-sodium diet, exercising more, and limiting alcohol. This information is not intended to replace advice given to you by your health care provider. Make sure you discuss any questions you have with your health care provider. Document Revised: 11/07/2018 Document Reviewed: 06/13/2016 Elsevier Patient Education  2020 [employer] Inc.  

## 2020-03-29 DIAGNOSIS — Z96641 Presence of right artificial hip joint: Secondary | ICD-10-CM | POA: Diagnosis not present

## 2020-03-31 ENCOUNTER — Encounter: Payer: Self-pay | Admitting: Gastroenterology

## 2020-04-05 DIAGNOSIS — Z471 Aftercare following joint replacement surgery: Secondary | ICD-10-CM | POA: Diagnosis not present

## 2020-04-05 DIAGNOSIS — Z96641 Presence of right artificial hip joint: Secondary | ICD-10-CM | POA: Diagnosis not present

## 2020-05-20 ENCOUNTER — Other Ambulatory Visit: Payer: Self-pay

## 2020-05-20 ENCOUNTER — Ambulatory Visit (AMBULATORY_SURGERY_CENTER): Payer: Self-pay | Admitting: *Deleted

## 2020-05-20 ENCOUNTER — Encounter: Payer: Self-pay | Admitting: Gastroenterology

## 2020-05-20 VITALS — Ht 66.0 in | Wt 175.0 lb

## 2020-05-20 DIAGNOSIS — Z1211 Encounter for screening for malignant neoplasm of colon: Secondary | ICD-10-CM

## 2020-05-20 MED ORDER — NA SULFATE-K SULFATE-MG SULF 17.5-3.13-1.6 GM/177ML PO SOLN: ORAL | 0 refills | Status: DC

## 2020-05-20 NOTE — Progress Notes (Signed)
Patient is here in-person for PV. Patient denies any allergies to eggs or soy. Patient denies any problems with anesthesia/sedation. Patient denies any oxygen use at home. Patient denies taking any diet/weight loss medications or blood thinners. Patient is not being treated for MRSA or C-diff. Patient is aware of our care-partner policy and Covid-19 safety protocol. EMMI education assisgned to the patient for the procedure, pt aware.    COVID-19 vaccines completed on 03/2020 3rd dose, per patient.

## 2020-05-25 DIAGNOSIS — Z96641 Presence of right artificial hip joint: Secondary | ICD-10-CM | POA: Diagnosis not present

## 2020-05-25 DIAGNOSIS — R9431 Abnormal electrocardiogram [ECG] [EKG]: Secondary | ICD-10-CM | POA: Diagnosis not present

## 2020-05-25 DIAGNOSIS — Z79899 Other long term (current) drug therapy: Secondary | ICD-10-CM | POA: Diagnosis not present

## 2020-05-25 DIAGNOSIS — I119 Hypertensive heart disease without heart failure: Secondary | ICD-10-CM | POA: Diagnosis not present

## 2020-05-25 DIAGNOSIS — M1611 Unilateral primary osteoarthritis, right hip: Secondary | ICD-10-CM | POA: Diagnosis not present

## 2020-05-25 DIAGNOSIS — Z01818 Encounter for other preprocedural examination: Secondary | ICD-10-CM | POA: Diagnosis not present

## 2020-06-03 ENCOUNTER — Ambulatory Visit (AMBULATORY_SURGERY_CENTER): Payer: Medicare HMO | Admitting: Gastroenterology

## 2020-06-03 ENCOUNTER — Other Ambulatory Visit: Payer: Self-pay

## 2020-06-03 ENCOUNTER — Encounter: Payer: Self-pay | Admitting: Gastroenterology

## 2020-06-03 VITALS — BP 106/70 | HR 69 | Temp 97.0°F | Resp 14 | Ht 66.0 in | Wt 175.0 lb

## 2020-06-03 DIAGNOSIS — I10 Essential (primary) hypertension: Secondary | ICD-10-CM | POA: Diagnosis not present

## 2020-06-03 DIAGNOSIS — Z1211 Encounter for screening for malignant neoplasm of colon: Secondary | ICD-10-CM | POA: Diagnosis not present

## 2020-06-03 HISTORY — PX: COLONOSCOPY: SHX174

## 2020-06-03 MED ORDER — SODIUM CHLORIDE 0.9 % IV SOLN: 500.0000 mL | Freq: Once | INTRAVENOUS | Status: DC

## 2020-06-03 NOTE — Patient Instructions (Signed)
EXCELLENT, NO POLYPS. RESUME PREVIOUS DIET CONTINUE CURRENT MEDICATIONS REPEAT COLONOSCOPY IN 10 YEARS  YOU HAD AN ENDOSCOPIC PROCEDURE TODAY AT THE [facility]:   Refer to the procedure report that was given to you for any specific questions about what was found during the examination.  If the procedure report does not answer your questions, please call your gastroenterologist to clarify.  If you requested that your care partner not be given the details of your procedure findings, then the procedure report has been included in a sealed envelope for you to review at your convenience later.  YOU SHOULD EXPECT: Some feelings of bloating in the abdomen. Passage of more gas than usual.  Walking can help get rid of the air that was put into your GI tract during the procedure and reduce the bloating. If you had a lower endoscopy (such as a colonoscopy or flexible sigmoidoscopy) you may notice spotting of blood in your stool or on the toilet paper. If you underwent a bowel prep for your procedure, you may not have a normal bowel movement for a few days.  Please Note:  You might notice some irritation and congestion in your nose or some drainage.  This is from the oxygen used during your procedure.  There is no need for concern and it should clear up in a day or so.  SYMPTOMS TO REPORT IMMEDIATELY:   Following lower endoscopy (colonoscopy or flexible sigmoidoscopy):  Excessive amounts of blood in the stool  Significant tenderness or worsening of abdominal pains  Swelling of the abdomen that is new, acute  Fever of 100F or higher  For urgent or emergent issues, a gastroenterologist can be reached at any hour by calling [phone]. Do not use MyChart messaging for urgent concerns.    DIET:  We do recommend a small meal at first, but then you may proceed to your regular diet.  Drink plenty of fluids but you should avoid alcoholic beverages for 24 hours.  ACTIVITY:  You should plan to  take it easy for the rest of today and you should NOT DRIVE or use heavy machinery until tomorrow (because of the sedation medicines used during the test).    FOLLOW UP: Our staff will call the number listed on your records 48-72 hours following your procedure to check on you and address any questions or concerns that you may have regarding the information given to you following your procedure. If we do not reach you, we will leave a message.  We will attempt to reach you two times.  During this call, we will ask if you have developed any symptoms of COVID 19. If you develop any symptoms (ie: fever, flu-like symptoms, shortness of breath, cough etc.) before then, please call [phone].  If you test positive for Covid 19 in the 2 weeks post procedure, please call and report this information to [place].    If any biopsies were taken you will be contacted by phone or by letter within the next 1-3 weeks.  Please call us at [phone] if you have not heard about the biopsies in 3 weeks.    SIGNATURES/CONFIDENTIALITY: You and/or your care partner have signed paperwork which will be entered into your electronic medical record.  These signatures attest to the fact that that the information above on your After Visit Summary has been reviewed and is understood.  Full responsibility of the confidentiality of this discharge information lies with you and/or your care-partner.

## 2020-06-03 NOTE — Progress Notes (Signed)
Report given to PACU, vss 

## 2020-06-03 NOTE — Op Note (Signed)
[facility] Patient Name: Darren Sherman Procedure Date: 06/03/2020 9:24 AM MRN: [id number] Endoscopist: Rachael Fee , MD Age: 64 Referring MD:  Date of Birth: 30-Sep-1955 Gender: Male Account #: [id number] Procedure:                Colonoscopy Indications:              Screening for colorectal malignant neoplasm Medicines:                Monitored Anesthesia Care Procedure:                Pre-Anesthesia Assessment:                           - Prior to the procedure, a History and Physical                            was performed, and patient medications and                            allergies were reviewed. The patient's tolerance of                            previous anesthesia was also reviewed. The risks                            and benefits of the procedure and the sedation                            options and risks were discussed with the patient.                            All questions were answered, and informed consent                            was obtained. Prior Anticoagulants: The patient has                            taken no previous anticoagulant or antiplatelet                            agents. ASA Grade Assessment: II - A patient with                            mild systemic disease. After reviewing the risks                            and benefits, the patient was deemed in                            satisfactory condition to undergo the procedure.                           After obtaining informed consent, the colonoscope  was passed under direct vision. Throughout the                            procedure, the patient's blood pressure, pulse, and                            oxygen saturations were monitored continuously. The                            Colonoscope was introduced through the anus and                            advanced to the the cecum, identified by                            appendiceal orifice and  ileocecal valve. The                            colonoscopy was performed without difficulty. The                            patient tolerated the procedure well. The quality                            of the bowel preparation was good. The ileocecal                            valve, appendiceal orifice, and rectum were                            photographed. Scope In: 9:26:35 AM Scope Out: 9:34:10 AM Scope Withdrawal Time: 0 hours 5 minutes 59 seconds  Total Procedure Duration: 0 hours 7 minutes 35 seconds  Findings:                 The entire examined colon appeared normal on direct                            and retroflexion views. Complications:            No immediate complications. Estimated blood loss:                            None. Estimated Blood Loss:     Estimated blood loss: none. Impression:               - The entire examined colon is normal on direct and                            retroflexion views.                           - No polyps or cancers. Recommendation:           - Patient has a contact number available for  emergencies. The signs and symptoms of potential                            delayed complications were discussed with the                            patient. Return to normal activities tomorrow.                            Written discharge instructions were provided to the                            patient.                           - Resume previous diet.                           - Continue present medications.                           - Repeat colonoscopy in 10 years for screening                            purposes. Rachael Fee, MD 06/03/2020 9:35:32 AM This report has been signed electronically.

## 2020-06-03 NOTE — Progress Notes (Signed)
Pt's states no medical or surgical changes since previsit or office visit.  Vitals cw

## 2020-06-07 ENCOUNTER — Telehealth: Payer: Self-pay

## 2020-06-07 NOTE — Telephone Encounter (Signed)
  Follow up Call-  Call back number 06/03/2020  Post procedure Call Back phone  # [phone]  Permission to leave phone message Yes  Some recent data might be hidden     Patient questions:  Do you have a fever, pain , or abdominal swelling? No. Pain Score  0 *  Have you tolerated food without any problems? Yes.    Have you been able to return to your normal activities? Yes.    Do you have any questions about your discharge instructions: Diet   No. Medications  No. Follow up visit  No.  Do you have questions or concerns about your Care? No.  Actions: * If pain score is 4 or above: 1. No action needed, pain <4.Have you developed a fever since your procedure? no  2.   Have you had an respiratory symptoms (SOB or cough) since your procedure? no  3.   Have you tested positive for COVID 19 since your procedure no  4.   Have you had any family members/close contacts diagnosed with the COVID 19 since your procedure?  no   If yes to any of these questions please route to Laverna Peace, RN and Karlton Lemon, RN

## 2020-06-20 DIAGNOSIS — Z87891 Personal history of nicotine dependence: Secondary | ICD-10-CM | POA: Diagnosis not present

## 2020-06-20 DIAGNOSIS — M978XXA Periprosthetic fracture around other internal prosthetic joint, initial encounter: Secondary | ICD-10-CM | POA: Diagnosis not present

## 2020-06-20 DIAGNOSIS — T8484XA Pain due to internal orthopedic prosthetic devices, implants and grafts, initial encounter: Secondary | ICD-10-CM | POA: Diagnosis not present

## 2020-06-20 DIAGNOSIS — S7291XA Unspecified fracture of right femur, initial encounter for closed fracture: Secondary | ICD-10-CM | POA: Diagnosis not present

## 2020-06-20 DIAGNOSIS — Z79899 Other long term (current) drug therapy: Secondary | ICD-10-CM | POA: Diagnosis not present

## 2020-06-20 DIAGNOSIS — Y658 Other specified misadventures during surgical and medical care: Secondary | ICD-10-CM | POA: Diagnosis not present

## 2020-06-20 DIAGNOSIS — T84038A Mechanical loosening of other internal prosthetic joint, initial encounter: Secondary | ICD-10-CM | POA: Diagnosis not present

## 2020-06-20 DIAGNOSIS — I10 Essential (primary) hypertension: Secondary | ICD-10-CM | POA: Diagnosis not present

## 2020-06-20 DIAGNOSIS — M9701XA Periprosthetic fracture around internal prosthetic right hip joint, initial encounter: Secondary | ICD-10-CM | POA: Diagnosis not present

## 2020-06-20 DIAGNOSIS — Z96649 Presence of unspecified artificial hip joint: Secondary | ICD-10-CM | POA: Diagnosis not present

## 2020-06-20 DIAGNOSIS — T84030A Mechanical loosening of internal right hip prosthetic joint, initial encounter: Secondary | ICD-10-CM | POA: Diagnosis not present

## 2020-06-20 DIAGNOSIS — Z20822 Contact with and (suspected) exposure to covid-19: Secondary | ICD-10-CM | POA: Diagnosis not present

## 2020-06-20 DIAGNOSIS — Z96641 Presence of right artificial hip joint: Secondary | ICD-10-CM | POA: Diagnosis not present

## 2020-06-20 DIAGNOSIS — T84010A Broken internal right hip prosthesis, initial encounter: Secondary | ICD-10-CM | POA: Diagnosis not present

## 2020-06-20 DIAGNOSIS — Y831 Surgical operation with implant of artificial internal device as the cause of abnormal reaction of the patient, or of later complication, without mention of misadventure at the time of the procedure: Secondary | ICD-10-CM | POA: Diagnosis not present

## 2020-06-20 DIAGNOSIS — Z9181 History of falling: Secondary | ICD-10-CM | POA: Diagnosis not present

## 2020-06-22 DIAGNOSIS — Z96649 Presence of unspecified artificial hip joint: Secondary | ICD-10-CM | POA: Insufficient documentation

## 2020-06-22 DIAGNOSIS — Y831 Surgical operation with implant of artificial internal device as the cause of abnormal reaction of the patient, or of later complication, without mention of misadventure at the time of the procedure: Secondary | ICD-10-CM | POA: Diagnosis not present

## 2020-06-22 DIAGNOSIS — T8484XA Pain due to internal orthopedic prosthetic devices, implants and grafts, initial encounter: Secondary | ICD-10-CM | POA: Diagnosis not present

## 2020-06-22 DIAGNOSIS — Z96641 Presence of right artificial hip joint: Secondary | ICD-10-CM | POA: Diagnosis not present

## 2020-06-22 DIAGNOSIS — M978XXA Periprosthetic fracture around other internal prosthetic joint, initial encounter: Secondary | ICD-10-CM | POA: Diagnosis not present

## 2020-06-22 DIAGNOSIS — M9701XA Periprosthetic fracture around internal prosthetic right hip joint, initial encounter: Secondary | ICD-10-CM | POA: Diagnosis not present

## 2020-06-22 DIAGNOSIS — G8918 Other acute postprocedural pain: Secondary | ICD-10-CM | POA: Insufficient documentation

## 2020-06-22 DIAGNOSIS — T84038A Mechanical loosening of other internal prosthetic joint, initial encounter: Secondary | ICD-10-CM | POA: Insufficient documentation

## 2020-06-22 DIAGNOSIS — Y658 Other specified misadventures during surgical and medical care: Secondary | ICD-10-CM | POA: Diagnosis not present

## 2020-06-22 DIAGNOSIS — T84030A Mechanical loosening of internal right hip prosthetic joint, initial encounter: Secondary | ICD-10-CM | POA: Diagnosis not present

## 2020-06-24 DIAGNOSIS — Z96649 Presence of unspecified artificial hip joint: Secondary | ICD-10-CM | POA: Diagnosis not present

## 2020-06-24 DIAGNOSIS — T84038A Mechanical loosening of other internal prosthetic joint, initial encounter: Secondary | ICD-10-CM | POA: Diagnosis not present

## 2020-06-27 ENCOUNTER — Ambulatory Visit: Payer: Self-pay | Admitting: Nurse Practitioner

## 2020-06-27 ENCOUNTER — Other Ambulatory Visit: Payer: Self-pay

## 2020-06-27 NOTE — Patient Outreach (Signed)
[facility]) Care Management  06/27/2020  Darren Sherman [birth year] [id number]     Transition of Care Referral  Referral Date: 06/27/2020 Referral Source: [employer] Discharge Report Date of Discharge: 06/24/2020 Facility: [employer] Insurance: [facility]    Outreach attempt # 1 to patient. Spoke with patient who was pleased to report how well he has been doing since he had surgery and returned home. He reports he has had no pain at all. He has been up walking around with use of walker. He is adhering to safety measures. Patient states he has been able to do everything for himself as normal  He has support system if needed. He confirms he has all his meds. He goes for MD follow up appt on 07/12/20 and 08/16/20. He has not been cleared to drive yet but states he has already secured someone to take him to appt. [facility] services reviewed and discussed. Patient appreciative of call but does not feel like he needs services a this time. He was provided with RN CM contact info to call if needed. He voiced understanding.     Plan: RN CM will close case at this time.    Antionette Fairy, RN,BSN,CCM [facility] Care Management Telephonic Care Management Coordinator Direct Phone: [phone] Toll Free: [phone] Fax: [phone]

## 2020-06-28 DIAGNOSIS — Z96641 Presence of right artificial hip joint: Secondary | ICD-10-CM | POA: Diagnosis not present

## 2020-06-28 DIAGNOSIS — Z23 Encounter for immunization: Secondary | ICD-10-CM | POA: Diagnosis not present

## 2020-06-28 DIAGNOSIS — I10 Essential (primary) hypertension: Secondary | ICD-10-CM | POA: Diagnosis not present

## 2020-06-28 DIAGNOSIS — T8451XA Infection and inflammatory reaction due to internal right hip prosthesis, initial encounter: Secondary | ICD-10-CM | POA: Diagnosis not present

## 2020-06-28 DIAGNOSIS — M1990 Unspecified osteoarthritis, unspecified site: Secondary | ICD-10-CM | POA: Diagnosis not present

## 2020-06-28 DIAGNOSIS — Z96651 Presence of right artificial knee joint: Secondary | ICD-10-CM | POA: Diagnosis not present

## 2020-06-28 DIAGNOSIS — Z87891 Personal history of nicotine dependence: Secondary | ICD-10-CM | POA: Diagnosis not present

## 2020-06-29 DIAGNOSIS — T84038A Mechanical loosening of other internal prosthetic joint, initial encounter: Secondary | ICD-10-CM | POA: Diagnosis not present

## 2020-06-29 DIAGNOSIS — Z96649 Presence of unspecified artificial hip joint: Secondary | ICD-10-CM | POA: Diagnosis not present

## 2020-07-06 DIAGNOSIS — T84038A Mechanical loosening of other internal prosthetic joint, initial encounter: Secondary | ICD-10-CM | POA: Diagnosis not present

## 2020-07-06 DIAGNOSIS — Z96649 Presence of unspecified artificial hip joint: Secondary | ICD-10-CM | POA: Diagnosis not present

## 2020-07-07 DIAGNOSIS — T84038A Mechanical loosening of other internal prosthetic joint, initial encounter: Secondary | ICD-10-CM | POA: Diagnosis not present

## 2020-07-07 DIAGNOSIS — Z96649 Presence of unspecified artificial hip joint: Secondary | ICD-10-CM | POA: Diagnosis not present

## 2020-07-11 DIAGNOSIS — T84038A Mechanical loosening of other internal prosthetic joint, initial encounter: Secondary | ICD-10-CM | POA: Diagnosis not present

## 2020-07-11 DIAGNOSIS — Z96649 Presence of unspecified artificial hip joint: Secondary | ICD-10-CM | POA: Diagnosis not present

## 2020-07-12 DIAGNOSIS — Z471 Aftercare following joint replacement surgery: Secondary | ICD-10-CM | POA: Diagnosis not present

## 2020-07-12 DIAGNOSIS — Z96641 Presence of right artificial hip joint: Secondary | ICD-10-CM | POA: Diagnosis not present

## 2020-07-13 DIAGNOSIS — T84038A Mechanical loosening of other internal prosthetic joint, initial encounter: Secondary | ICD-10-CM | POA: Diagnosis not present

## 2020-07-13 DIAGNOSIS — Z96649 Presence of unspecified artificial hip joint: Secondary | ICD-10-CM | POA: Diagnosis not present

## 2020-07-14 DIAGNOSIS — T8451XA Infection and inflammatory reaction due to internal right hip prosthesis, initial encounter: Secondary | ICD-10-CM | POA: Diagnosis not present

## 2020-07-14 DIAGNOSIS — I10 Essential (primary) hypertension: Secondary | ICD-10-CM | POA: Diagnosis not present

## 2020-07-14 DIAGNOSIS — Z96651 Presence of right artificial knee joint: Secondary | ICD-10-CM | POA: Diagnosis not present

## 2020-07-14 DIAGNOSIS — Z87891 Personal history of nicotine dependence: Secondary | ICD-10-CM | POA: Diagnosis not present

## 2020-07-15 DIAGNOSIS — T84038A Mechanical loosening of other internal prosthetic joint, initial encounter: Secondary | ICD-10-CM | POA: Diagnosis not present

## 2020-07-15 DIAGNOSIS — Z96649 Presence of unspecified artificial hip joint: Secondary | ICD-10-CM | POA: Diagnosis not present

## 2020-07-18 DIAGNOSIS — T84038A Mechanical loosening of other internal prosthetic joint, initial encounter: Secondary | ICD-10-CM | POA: Diagnosis not present

## 2020-07-18 DIAGNOSIS — Z96649 Presence of unspecified artificial hip joint: Secondary | ICD-10-CM | POA: Diagnosis not present

## 2020-07-21 DIAGNOSIS — T84038A Mechanical loosening of other internal prosthetic joint, initial encounter: Secondary | ICD-10-CM | POA: Diagnosis not present

## 2020-07-21 DIAGNOSIS — Z96649 Presence of unspecified artificial hip joint: Secondary | ICD-10-CM | POA: Diagnosis not present

## 2020-07-22 DIAGNOSIS — T84038A Mechanical loosening of other internal prosthetic joint, initial encounter: Secondary | ICD-10-CM | POA: Diagnosis not present

## 2020-07-22 DIAGNOSIS — Z96649 Presence of unspecified artificial hip joint: Secondary | ICD-10-CM | POA: Diagnosis not present

## 2020-07-23 DIAGNOSIS — T84038A Mechanical loosening of other internal prosthetic joint, initial encounter: Secondary | ICD-10-CM | POA: Diagnosis not present

## 2020-07-23 DIAGNOSIS — Z96649 Presence of unspecified artificial hip joint: Secondary | ICD-10-CM | POA: Diagnosis not present

## 2020-07-24 DIAGNOSIS — T84038A Mechanical loosening of other internal prosthetic joint, initial encounter: Secondary | ICD-10-CM | POA: Diagnosis not present

## 2020-07-24 DIAGNOSIS — Z96649 Presence of unspecified artificial hip joint: Secondary | ICD-10-CM | POA: Diagnosis not present

## 2020-07-25 DIAGNOSIS — T84038A Mechanical loosening of other internal prosthetic joint, initial encounter: Secondary | ICD-10-CM | POA: Diagnosis not present

## 2020-07-25 DIAGNOSIS — Z96649 Presence of unspecified artificial hip joint: Secondary | ICD-10-CM | POA: Diagnosis not present

## 2020-07-26 DIAGNOSIS — Z96649 Presence of unspecified artificial hip joint: Secondary | ICD-10-CM | POA: Diagnosis not present

## 2020-07-26 DIAGNOSIS — T84038A Mechanical loosening of other internal prosthetic joint, initial encounter: Secondary | ICD-10-CM | POA: Diagnosis not present

## 2020-07-27 DIAGNOSIS — T84038A Mechanical loosening of other internal prosthetic joint, initial encounter: Secondary | ICD-10-CM | POA: Diagnosis not present

## 2020-07-27 DIAGNOSIS — Z96649 Presence of unspecified artificial hip joint: Secondary | ICD-10-CM | POA: Diagnosis not present

## 2020-07-28 DIAGNOSIS — Z96649 Presence of unspecified artificial hip joint: Secondary | ICD-10-CM | POA: Diagnosis not present

## 2020-07-28 DIAGNOSIS — T84038A Mechanical loosening of other internal prosthetic joint, initial encounter: Secondary | ICD-10-CM | POA: Diagnosis not present

## 2020-08-01 DIAGNOSIS — T84038A Mechanical loosening of other internal prosthetic joint, initial encounter: Secondary | ICD-10-CM | POA: Diagnosis not present

## 2020-08-01 DIAGNOSIS — Z96649 Presence of unspecified artificial hip joint: Secondary | ICD-10-CM | POA: Diagnosis not present

## 2020-08-04 DIAGNOSIS — Z96649 Presence of unspecified artificial hip joint: Secondary | ICD-10-CM | POA: Diagnosis not present

## 2020-08-04 DIAGNOSIS — T84038A Mechanical loosening of other internal prosthetic joint, initial encounter: Secondary | ICD-10-CM | POA: Diagnosis not present

## 2020-08-08 DIAGNOSIS — T84038A Mechanical loosening of other internal prosthetic joint, initial encounter: Secondary | ICD-10-CM | POA: Diagnosis not present

## 2020-08-08 DIAGNOSIS — Z96649 Presence of unspecified artificial hip joint: Secondary | ICD-10-CM | POA: Diagnosis not present

## 2020-08-11 DIAGNOSIS — Z96649 Presence of unspecified artificial hip joint: Secondary | ICD-10-CM | POA: Diagnosis not present

## 2020-08-11 DIAGNOSIS — T84038A Mechanical loosening of other internal prosthetic joint, initial encounter: Secondary | ICD-10-CM | POA: Diagnosis not present

## 2020-08-19 DIAGNOSIS — Z96641 Presence of right artificial hip joint: Secondary | ICD-10-CM | POA: Diagnosis not present

## 2020-08-19 DIAGNOSIS — Z471 Aftercare following joint replacement surgery: Secondary | ICD-10-CM | POA: Diagnosis not present

## 2020-08-22 ENCOUNTER — Ambulatory Visit (INDEPENDENT_AMBULATORY_CARE_PROVIDER_SITE_OTHER): Payer: Medicare HMO | Admitting: Nurse Practitioner

## 2020-08-22 ENCOUNTER — Encounter: Payer: Self-pay | Admitting: Nurse Practitioner

## 2020-08-22 ENCOUNTER — Other Ambulatory Visit: Payer: Self-pay

## 2020-08-22 VITALS — BP 162/91 | HR 82 | Temp 98.4°F | Ht 66.0 in | Wt 176.6 lb

## 2020-08-22 DIAGNOSIS — Z23 Encounter for immunization: Secondary | ICD-10-CM | POA: Diagnosis not present

## 2020-08-22 DIAGNOSIS — Z1159 Encounter for screening for other viral diseases: Secondary | ICD-10-CM

## 2020-08-22 DIAGNOSIS — I10 Essential (primary) hypertension: Secondary | ICD-10-CM | POA: Diagnosis not present

## 2020-08-22 MED ORDER — AMLODIPINE BESYLATE 10 MG PO TABS: 10.0000 mg | ORAL_TABLET | Freq: Every day | ORAL | 3 refills | Status: DC

## 2020-08-22 NOTE — Patient Instructions (Signed)
Managing Your Hypertension Hypertension, also called high blood pressure, is when the force of the blood pressing against the walls of the arteries is too strong. Arteries are blood vessels that carry blood from your heart throughout your body. Hypertension forces the heart to work harder to pump blood and may cause the arteries to become narrow or stiff. Understanding blood pressure readings Your personal target blood pressure may vary depending on your medical conditions, your age, and other factors. A blood pressure reading includes a higher number over a lower number. Ideally, your blood pressure should be below 120/80. You should know that:  The first, or top, number is called the systolic pressure. It is a measure of the pressure in your arteries as your heart beats.  The second, or bottom number, is called the diastolic pressure. It is a measure of the pressure in your arteries as the heart relaxes. Blood pressure is classified into four stages. Based on your blood pressure reading, your health care provider may use the following stages to determine what type of treatment you need, if any. Systolic pressure and diastolic pressure are measured in a unit called mmHg. Normal  Systolic pressure: below 120.  Diastolic pressure: below 80. Elevated  Systolic pressure: 120-129.  Diastolic pressure: below 80. Hypertension stage 1  Systolic pressure: 130-139.  Diastolic pressure: 80-89. Hypertension stage 2  Systolic pressure: 140 or above.  Diastolic pressure: 90 or above. How can this condition affect me? Managing your hypertension is an important responsibility. Over time, hypertension can damage the arteries and decrease blood flow to important parts of the body, including the brain, heart, and kidneys. Having untreated or uncontrolled hypertension can lead to:  A heart attack.  A stroke.  A weakened blood vessel (aneurysm).  Heart failure.  Kidney damage.  Eye  damage.  Metabolic syndrome.  Memory and concentration problems.  Vascular dementia. What actions can I take to manage this condition? Hypertension can be managed by making lifestyle changes and possibly by taking medicines. Your health care provider will help you make a plan to bring your blood pressure within a normal range. Nutrition  Eat a diet that is high in fiber and potassium, and low in salt (sodium), added sugar, and fat. An example eating plan is called the Dietary Approaches to Stop Hypertension (DASH) diet. To eat this way: ? Eat plenty of fresh fruits and vegetables. Try to fill one-half of your plate at each meal with fruits and vegetables. ? Eat whole grains, such as whole-wheat pasta, brown rice, or whole-grain bread. Fill about one-fourth of your plate with whole grains. ? Eat low-fat dairy products. ? Avoid fatty cuts of meat, processed or cured meats, and poultry with skin. Fill about one-fourth of your plate with lean proteins such as fish, chicken without skin, beans, eggs, and tofu. ? Avoid pre-made and processed foods. These tend to be higher in sodium, added sugar, and fat.  Reduce your daily sodium intake. Most people with hypertension should eat less than 1,500 mg of sodium a day.   Lifestyle  Work with your health care provider to maintain a healthy body weight or to lose weight. Ask what an ideal weight is for you.  Get at least 30 minutes of exercise that causes your heart to beat faster (aerobic exercise) most days of the week. Activities may include walking, swimming, or biking.  Include exercise to strengthen your muscles (resistance exercise), such as weight lifting, as part of your weekly exercise routine. Try   to do these types of exercises for 30 minutes at least 3 days a week.  Do not use any products that contain nicotine or tobacco, such as cigarettes, e-cigarettes, and chewing tobacco. If you need help quitting, ask your health care  provider.  Control any long-term (chronic) conditions you have, such as high cholesterol or diabetes.  Identify your sources of stress and find ways to manage stress. This may include meditation, deep breathing, or making time for fun activities.   Alcohol use  Do not drink alcohol if: ? Your health care provider tells you not to drink. ? You are pregnant, may be pregnant, or are planning to become pregnant.  If you drink alcohol: ? Limit how much you use to:  0-1 drink a day for women.  0-2 drinks a day for men. ? Be aware of how much alcohol is in your drink. In the [place] one drink equals one 12 oz bottle of beer (355 mL), one 5 oz glass of wine (148 mL), or one 1 oz glass of hard liquor (44 mL). Medicines Your health care provider may prescribe medicine if lifestyle changes are not enough to get your blood pressure under control and if:  Your systolic blood pressure is 130 or higher.  Your diastolic blood pressure is 80 or higher. Take medicines only as told by your health care provider. Follow the directions carefully. Blood pressure medicines must be taken as told by your health care provider. The medicine does not work as well when you skip doses. Skipping doses also puts you at risk for problems. Monitoring Before you monitor your blood pressure:  Do not smoke, drink caffeinated beverages, or exercise within 30 minutes before taking a measurement.  Use the bathroom and empty your bladder (urinate).  Sit quietly for at least 5 minutes before taking measurements. Monitor your blood pressure at home as told by your health care provider. To do this:  Sit with your back straight and supported.  Place your feet flat on the floor. Do not cross your legs.  Support your arm on a flat surface, such as a table. Make sure your upper arm is at heart level.  Each time you measure, take two or three readings one minute apart and record the results. You may also need to have your  blood pressure checked regularly by your health care provider.   General information  Talk with your health care provider about your diet, exercise habits, and other lifestyle factors that may be contributing to hypertension.  Review all the medicines you take with your health care provider because there may be side effects or interactions.  Keep all visits as told by your health care provider. Your health care provider can help you create and adjust your plan for managing your high blood pressure. Where to find more information  National Heart, Lung, and Blood Institute: www.nhlbi.nih.gov  American Heart Association: www.heart.org Contact a health care provider if:  You think you are having a reaction to medicines you have taken.  You have repeated (recurrent) headaches.  You feel dizzy.  You have swelling in your ankles.  You have trouble with your vision. Get help right away if:  You develop a severe headache or confusion.  You have unusual weakness or numbness, or you feel faint.  You have severe pain in your chest or abdomen.  You vomit repeatedly.  You have trouble breathing. These symptoms may represent a serious problem that is an emergency. Do not wait   to see if the symptoms will go away. Get medical help right away. Call your local emergency services (911 in the [place] Do not drive yourself to the hospital. Summary  Hypertension is when the force of blood pumping through your arteries is too strong. If this condition is not controlled, it may put you at risk for serious complications.  Your personal target blood pressure may vary depending on your medical conditions, your age, and other factors. For most people, a normal blood pressure is less than 120/80.  Hypertension is managed by lifestyle changes, medicines, or both.  Lifestyle changes to help manage hypertension include losing weight, eating a healthy, low-sodium diet, exercising more, stopping smoking, and  limiting alcohol. This information is not intended to replace advice given to you by your health care provider. Make sure you discuss any questions you have with your health care provider. Document Revised: 08/21/2019 Document Reviewed: 06/16/2019 Elsevier Patient Education  2021 [employer].  

## 2020-08-22 NOTE — Progress Notes (Signed)
[facility] Patient [facility] [address] [place], [place]  [address] Phone:  [phone]   Fax:  [phone]   Established Patient Office Visit  Subjective:  Patient ID: Darren Sherman, male    DOB: Mar 27, 1956  Age: 65 y.o. MRN: [id number]  CC: No chief complaint on file.   HPI Darren Sherman presents for follow up. He  has a past medical history of Hypertension.    He underwent his right hip revision on 07/22/21. He is doing great. He denies any pain. He did home therapy . He used his walker for 2 weeks. He is walking without his cane. He does have some stiffness in his right leg including the knee. He is finishing his anbx, doxy.   Hypertension Patient is here for follow-up of elevated blood pressure. He is not exercising, he is in home PT for his hip. He is adherent to a low-salt diet.He admits that he loves pork rinds.  Blood pressure varies at home. Cardiac symptoms: none. Patient denies chest pain, dyspnea, irregular heart beat, lower extremity edema and palpitations. Cardiovascular risk factors: advanced age (older than 26 for men, 10 for women), diabetes mellitus, hypertension and male gender. Use of agents associated with hypertension: none. History of target organ damage: none.  Past Medical History:  Diagnosis Date   Hypertension     Past Surgical History:  Procedure Laterality Date   COLONOSCOPY  06/03/2020   MASS EXCISION  09/2019   TOTAL HIP ARTHROPLASTY      Family History  Problem Relation Age of Onset   Diabetes Father    Colon cancer Neg Hx    Colon polyps Neg Hx    Esophageal cancer Neg Hx    Rectal cancer Neg Hx    Stomach cancer Neg Hx     Social History   Socioeconomic History   Marital status: Single    Spouse name: Not on file   Number of children: Not on file   Years of education: Not on file   Highest education level: Not on file  Occupational History   Not on file  Tobacco Use   Smoking status: Never Smoker    Smokeless tobacco: Never Used  Vaping Use   Vaping Use: Never used  Substance and Sexual Activity   Alcohol use: No   Drug use: No   Sexual activity: Not on file  Other Topics Concern   Not on file  Social History Narrative   Not on file   Social Determinants of Health   Financial Resource Strain: Not on file  Food Insecurity: Not on file  Transportation Needs: Not on file  Physical Activity: Not on file  Stress: Not on file  Social Connections: Not on file  Intimate Partner Violence: Not on file    Outpatient Medications Prior to Visit  Medication Sig Dispense Refill   acetaminophen (TYLENOL) 500 MG tablet Take by mouth.     amLODipine (NORVASC) 5 MG tablet Take 1 tablet (5 mg total) by mouth daily. 90 tablet 3   No facility-administered medications prior to visit.    No Known Allergies  ROS Review of Systems    Objective:    Physical Exam Constitutional:      General: He is not in acute distress.    Appearance: He is normal weight. He is not toxic-appearing.  HENT:     Head: Normocephalic and atraumatic.     Nose: Nose normal.     Mouth/Throat:  Mouth: Mucous membranes are moist.  Cardiovascular:     Rate and Rhythm: Normal rate and regular rhythm.     Pulses: Normal pulses.     Heart sounds: Normal heart sounds.  Pulmonary:     Effort: Pulmonary effort is normal.     Breath sounds: Normal breath sounds.  Abdominal:     Palpations: Abdomen is soft.  Musculoskeletal:        General: Normal range of motion.     Cervical back: Normal range of motion.  Skin:    General: Skin is warm and dry.     Capillary Refill: Capillary refill takes less than 2 seconds.  Neurological:     General: No focal deficit present.     Mental Status: He is alert and oriented to person, place, and time.  Psychiatric:        Mood and Affect: Mood normal.        Behavior: Behavior normal.        Thought Content: Thought content normal.        Judgment: Judgment  normal.     BP (!) 162/91 (BP Location: Right Arm, Patient Position: Sitting, Cuff Size: Normal)    Pulse 82    Temp 98.4 F (36.9 C)    Wt 176 lb 9.6 oz (80.1 kg)    SpO2 98%    BMI 28.50 kg/m  Wt Readings from Last 3 Encounters:  08/22/20 176 lb 9.6 oz (80.1 kg)  06/03/20 175 lb (79.4 kg)  05/20/20 175 lb (79.4 kg)     There are no preventive care reminders to display for this patient.  There are no preventive care reminders to display for this patient.  Lab Results  Component Value Date   TSH 2.770 10/28/2019   Lab Results  Component Value Date   WBC 4.1 12/30/2019   HGB 15.2 12/30/2019   HCT 45.1 12/30/2019   MCV 88 12/30/2019   PLT 198 12/30/2019   Lab Results  Component Value Date   NA 143 08/22/2020   K 4.8 08/22/2020   CO2 24 08/09/2013   GLUCOSE 85 08/22/2020   BUN 13 08/22/2020   CREATININE 0.97 08/22/2020   BILITOT <0.2 08/22/2020   ALKPHOS 122 (H) 08/22/2020   AST 16 08/22/2020   ALT 17 08/09/2013   PROT 7.5 08/22/2020   ALBUMIN 4.3 08/22/2020   CALCIUM 9.5 08/22/2020   Lab Results  Component Value Date   CHOL 199 11/02/2019   Lab Results  Component Value Date   HDL 62 11/02/2019   Lab Results  Component Value Date   LDLCALC 111 (H) 11/02/2019   Lab Results  Component Value Date   TRIG 151 (H) 11/02/2019   Lab Results  Component Value Date   CHOLHDL 3.2 11/02/2019   No results found for: HGBA1C    Assessment & Plan:   Problem List Items Addressed This Visit      Cardiovascular and Mediastinum   Essential hypertension - Primary Persistent  Increased amlodipine 10 mg daily Encouraged home monitoring and recording BP <130/80 Eating a heart-healthy diet with less salt Encouraged regular physical activity  Recommend Weight loss    Relevant Medications   amLODipine (NORVASC) 10 MG tablet   Other Relevant Orders   Comp. Metabolic Panel (12) (Completed)    Other Visit Diagnoses    Encounter for hepatitis C screening test for  low risk patient       Relevant Orders   Hepatitis C antibody (Completed)  Meds ordered this encounter  Medications   amLODipine (NORVASC) 10 MG tablet    Sig: Take 1 tablet (10 mg total) by mouth daily.    Dispense:  90 tablet    Refill:  3    Order Specific Question:   Supervising Provider    Answer:   Quentin Angst [id number]    Follow-up: Return in about 3 months (around 11/20/2020) for Follow up HTN [id number].    Barbette Merino, NP

## 2020-08-23 LAB — COMP. METABOLIC PANEL (12)
AST: 16 IU/L (ref 0–40)
Albumin/Globulin Ratio: 1.3 (ref 1.2–2.2)
Albumin: 4.3 g/dL (ref 3.8–4.8)
Alkaline Phosphatase: 122 IU/L — ABNORMAL HIGH (ref 44–121)
BUN/Creatinine Ratio: 13 (ref 10–24)
BUN: 13 mg/dL (ref 8–27)
Bilirubin Total: <0.2 mg/dL (ref 0.0–1.2)
Calcium: 9.5 mg/dL (ref 8.6–10.2)
Chloride: 103 mmol/L (ref 96–106)
Creatinine, Ser: 0.97 mg/dL (ref 0.76–1.27)
GFR calc Af Amer: 94 mL/min/{1.73_m2} (ref >59)
GFR calc non Af Amer: 82 mL/min/{1.73_m2} (ref >59)
Globulin, Total: 3.2 g/dL (ref 1.5–4.5)
Glucose: 85 mg/dL (ref 65–99)
Potassium: 4.8 mmol/L (ref 3.5–5.2)
Sodium: 143 mmol/L (ref 134–144)
Total Protein: 7.5 g/dL (ref 6.0–8.5)

## 2020-08-23 LAB — HEPATITIS C ANTIBODY: Hep C Virus Ab: <0.1 s/co ratio (ref 0.0–0.9)

## 2020-10-11 DIAGNOSIS — T8451XD Infection and inflammatory reaction due to internal right hip prosthesis, subsequent encounter: Secondary | ICD-10-CM | POA: Diagnosis not present

## 2020-10-11 DIAGNOSIS — Z87891 Personal history of nicotine dependence: Secondary | ICD-10-CM | POA: Diagnosis not present

## 2020-10-11 DIAGNOSIS — I10 Essential (primary) hypertension: Secondary | ICD-10-CM | POA: Diagnosis not present

## 2020-10-14 DIAGNOSIS — M1612 Unilateral primary osteoarthritis, left hip: Secondary | ICD-10-CM | POA: Diagnosis not present

## 2020-10-14 DIAGNOSIS — Z96641 Presence of right artificial hip joint: Secondary | ICD-10-CM | POA: Diagnosis not present

## 2020-11-23 ENCOUNTER — Encounter: Payer: Self-pay | Admitting: Nurse Practitioner

## 2020-11-23 ENCOUNTER — Other Ambulatory Visit: Payer: Self-pay

## 2020-11-23 ENCOUNTER — Ambulatory Visit (INDEPENDENT_AMBULATORY_CARE_PROVIDER_SITE_OTHER): Payer: Medicare HMO | Admitting: Nurse Practitioner

## 2020-11-23 VITALS — BP 139/67 | HR 63 | Temp 98.6°F | Ht 66.0 in | Wt 177.0 lb

## 2020-11-23 DIAGNOSIS — R600 Localized edema: Secondary | ICD-10-CM | POA: Diagnosis not present

## 2020-11-23 DIAGNOSIS — I10 Essential (primary) hypertension: Secondary | ICD-10-CM | POA: Diagnosis not present

## 2020-11-23 MED ORDER — FUROSEMIDE 20 MG PO TABS: 20.0000 mg | ORAL_TABLET | Freq: Every day | ORAL | 0 refills | Status: DC

## 2020-11-23 NOTE — Patient Instructions (Addendum)
Edema  Edema is when you have too much fluid in your body or under your skin. Edema may make your legs, feet, and ankles swell up. Swelling is also common in looser tissues, like around your eyes. This is a common condition. It gets more common as you get older. There are many possible causes of edema. Eating too much salt (sodium) and being on your feet or sitting for a long time can cause edema in your legs, feet, and ankles. Hot weather may make edema worse. Edema is usually painless. Your skin may look swollen or shiny. Follow these instructions at home:  Keep the swollen body part raised (elevated) above the level of your heart when you are sitting or lying down.  Do not sit still or stand for a long time.  Do not wear tight clothes. Do not wear garters on your upper legs.  Exercise your legs. This can help the swelling go down.  Wear elastic bandages or support stockings as told by your doctor.  Eat a low-salt (low-sodium) diet to reduce fluid as told by your doctor.  Depending on the cause of your swelling, you may need to limit how much fluid you drink (fluid restriction).  Take over-the-counter and prescription medicines only as told by your doctor. Contact a doctor if:  Treatment is not working.  You have heart, liver, or kidney disease and have symptoms of edema.  You have sudden and unexplained weight gain. Get help right away if:  You have shortness of breath or chest pain.  You cannot breathe when you lie down.  You have pain, redness, or warmth in the swollen areas.  You have heart, liver, or kidney disease and get edema all of a sudden.  You have a fever and your symptoms get worse all of a sudden. Summary  Edema is when you have too much fluid in your body or under your skin.  Edema may make your legs, feet, and ankles swell up. Swelling is also common in looser tissues, like around your eyes.  Raise (elevate) the swollen body part above the level of  your heart when you are sitting or lying down.  Follow your doctor's instructions about diet and how much fluid you can drink (fluid restriction). This information is not intended to replace advice given to you by your health care provider. Make sure you discuss any questions you have with your health care provider. Document Revised: 05/11/2020 Document Reviewed: 05/11/2020 Elsevier Patient Education  2021 Else[employer] Inc. Hypertension, Adult Hypertension is another name for high blood pressure. High blood pressure forces your heart to work harder to pump blood. This can cause problems over time. There are two numbers in a blood pressure reading. There is a top number (systolic) over a bottom number (diastolic). It is best to have a blood pressure that is below 120/80. Healthy choices can help lower your blood pressure, or you may need medicine to help lower it. What are the causes? The cause of this condition is not known. Some conditions may be related to high blood pressure. What increases the risk?  Smoking.  Having type 2 diabetes mellitus, high cholesterol, or both.  Not getting enough exercise or physical activity.  Being overweight.  Having too much fat, sugar, calories, or salt (sodium) in your diet.  Drinking too much alcohol.  Having long-term (chronic) kidney disease.  Having a family history of high blood pressure.  Age. Risk increases with age.  Race. You may be  at higher risk if you are African American.  Gender. Men are at higher risk than women before age 71. After age 8, women are at higher risk than men.  Having obstructive sleep apnea.  Stress. What are the signs or symptoms?  High blood pressure may not cause symptoms. Very high blood pressure (hypertensive crisis) may cause: ? Headache. ? Feelings of worry or nervousness (anxiety). ? Shortness of breath. ? Nosebleed. ? A feeling of being sick to your stomach (nausea). ? Throwing up  (vomiting). ? Changes in how you see. ? Very bad chest pain. ? Seizures. How is this treated?  This condition is treated by making healthy lifestyle changes, such as: ? Eating healthy foods. ? Exercising more. ? Drinking less alcohol.  Your health care provider may prescribe medicine if lifestyle changes are not enough to get your blood pressure under control, and if: ? Your top number is above 130. ? Your bottom number is above 80.  Your personal target blood pressure may vary. Follow these instructions at home: Eating and drinking  If told, follow the DASH eating plan. To follow this plan: ? Fill one half of your plate at each meal with fruits and vegetables. ? Fill one fourth of your plate at each meal with whole grains. Whole grains include whole-wheat pasta, brown rice, and whole-grain bread. ? Eat or drink low-fat dairy products, such as skim milk or low-fat yogurt. ? Fill one fourth of your plate at each meal with low-fat (lean) proteins. Low-fat proteins include fish, chicken without skin, eggs, beans, and tofu. ? Avoid fatty meat, cured and processed meat, or chicken with skin. ? Avoid pre-made or processed food.  Eat less than 1,500 mg of salt each day.  Do not drink alcohol if: ? Your doctor tells you not to drink. ? You are pregnant, may be pregnant, or are planning to become pregnant.  If you drink alcohol: ? Limit how much you use to:  0-1 drink a day for women.  0-2 drinks a day for men. ? Be aware of how much alcohol is in your drink. In the [place] one drink equals one 12 oz bottle of beer (355 mL), one 5 oz glass of wine (148 mL), or one 1 oz glass of hard liquor (44 mL).   Lifestyle  Work with your doctor to stay at a healthy weight or to lose weight. Ask your doctor what the best weight is for you.  Get at least 30 minutes of exercise most days of the week. This may include walking, swimming, or biking.  Get at least 30 minutes of exercise that  strengthens your muscles (resistance exercise) at least 3 days a week. This may include lifting weights or doing Pilates.  Do not use any products that contain nicotine or tobacco, such as cigarettes, e-cigarettes, and chewing tobacco. If you need help quitting, ask your doctor.  Check your blood pressure at home as told by your doctor.  Keep all follow-up visits as told by your doctor. This is important.   Medicines  Take over-the-counter and prescription medicines only as told by your doctor. Follow directions carefully.  Do not skip doses of blood pressure medicine. The medicine does not work as well if you skip doses. Skipping doses also puts you at risk for problems.  Ask your doctor about side effects or reactions to medicines that you should watch for. Contact a doctor if you:  Think you are having a reaction to the medicine  you are taking.  Have headaches that keep coming back (recurring).  Feel dizzy.  Have swelling in your ankles.  Have trouble with your vision. Get help right away if you:  Get a very bad headache.  Start to feel mixed up (confused).  Feel weak or numb.  Feel faint.  Have very bad pain in your: ? Chest. ? Belly (abdomen).  Throw up more than once.  Have trouble breathing. Summary  Hypertension is another name for high blood pressure.  High blood pressure forces your heart to work harder to pump blood.  For most people, a normal blood pressure is less than 120/80.  Making healthy choices can help lower blood pressure. If your blood pressure does not get lower with healthy choices, you may need to take medicine. This information is not intended to replace advice given to you by your health care provider. Make sure you discuss any questions you have with your health care provider. Document Revised: 03/26/2018 Document Reviewed: 03/26/2018 Elsevier Patient Education  2021 Elsevier Inc.   Allergic Rhinitis, Adult Allergic rhinitis is a  reaction to allergens. Allergens are things that can cause an allergic reaction. This condition affects the lining inside the nose (mucous membrane). There are two types of allergic rhinitis: Seasonal. This type is also called hay fever. It happens only during some times of the year. Perennial. This type can happen at any time of the year. This condition cannot be spread from person to person (is not contagious). It can be mild, worse, or very bad. It can develop at any age and may be outgrown. What are the causes? This condition may be caused by: Pollen from grasses, trees, and weeds. Dust mites. Smoke. Mold. Car fumes. The pee (urine), spit, or dander of pets. Dander is dead skin cells from a pet.   What increases the risk? You are more likely to develop this condition if: You have allergies in your family. You have problems like allergies in your family. You may have: Swelling of parts of your eyes and eyelids. Asthma. This affects how you breathe. Long-term redness and swelling on your skin. Food allergies. What are the signs or symptoms? The main symptom of this condition is a runny or stuffy nose (nasal congestion). Other symptoms may include: Sneezing or coughing. Itching and tearing of your eyes. Mucus that drips down the back of your throat (postnasal drip). Trouble sleeping. Feeling tired. Headache. Sore throat. How is this treated? There is no cure for this condition. You should avoid things that you are allergic to. Treatment can help to relieve symptoms. This may include: Medicines that block allergy symptoms, such as corticosteroids or antihistamines. These may be given as a shot, nasal spray, or pill. Avoiding things you are allergic to. Medicines that give you bits of what you are allergic to over time. This is called immunotherapy. It is done if other treatments do not help. You may get: Shots. Medicine under your tongue. Stronger medicines, if other treatments  do not help. Follow these instructions at home: Avoiding allergens Find out what things you are allergic to and avoid them. To do this, try these things: If you get allergies any time of year: Replace carpet with wood, tile, or vinyl flooring. Carpet can trap pet dander and dust. Do not smoke. Do not allow smoking in your home. Change your heating and air conditioning filters at least once a month. If you get allergies only some times of the year: Keep windows closed  when you can. Plan things to do outside when pollen counts are lowest. Check pollen counts before you plan things to do outside. When you come indoors, change your clothes and shower before you sit on furniture or bedding.  If you are allergic to a pet: Keep the pet out of your bedroom. Vacuum, sweep, and dust often.   General instructions Take over-the-counter and prescription medicines only as told by your doctor. Drink enough fluid to keep your pee (urine) pale yellow. Keep all follow-up visits as told by your doctor. This is important. Where to find more information American Academy of Allergy, Asthma & Immunology: www.aaaai.org Contact a doctor if: You have a fever. You get a cough that does not go away. You make whistling sounds when you breathe (wheeze). Your symptoms slow you down. Your symptoms stop you from doing your normal things each day. Get help right away if: You are short of breath. This symptom may be an emergency. Do not wait to see if the symptom will go away. Get medical help right away. Call your local emergency services (911 in the [place] Do not drive yourself to the hospital. Summary Allergic rhinitis may be treated by taking medicines and avoiding things you are allergic to. If you have allergies only some of the year, keep windows closed when you can at those times. Contact your doctor if you get a fever or a cough that does not go away. This information is not intended to replace advice given  to you by your health care provider. Make sure you discuss any questions you have with your health care provider. Document Revised: 09/07/2019 Document Reviewed: 07/14/2019 Elsevier Patient Education  2021 [employer].

## 2020-11-23 NOTE — Progress Notes (Signed)
[place], [place]  [address] Phone:  [phone]   Fax:  [phone]   Established Patient Office Visit  Subjective:  Patient ID: Darren Sherman, male    DOB: February 23, 1956  Age: 65 y.o. MRN: [id number]  CC:  Chief Complaint  Patient presents with  . Follow-up    Follow up for htn , having some congestion  started  on Monday , runny nose , scratchy throat , coughing some  no fever think its  allergy      HPI Jobani Sabado presents for follow up. He  has a past medical history of Hypertension.   Hypertension Patient is here for follow-up of elevated blood pressure. He is not exercising and is adherent to a low-salt diet. Blood pressure is well controlled at home. Cardiac symptoms: lower extremity edema.He feels like the swelling is related to his right hip surgery. he has been in contact with surgeon. He is scheduling a follow up. Patient denies chest pain, chest pressure/discomfort, dyspnea, exertional chest pressure/discomfort, irregular heart beat and syncope. Cardiovascular risk factors: advanced age (older than 12 for men, 1 for women), hypertension, male gender and sedentary lifestyle. Use of agents associated with hypertension: none. History of target organ damage: none.   Past Medical History:  Diagnosis Date  . Hypertension     Past Surgical History:  Procedure Laterality Date  . COLONOSCOPY  06/03/2020  . MASS EXCISION  09/2019  . TOTAL HIP ARTHROPLASTY      Family History  Problem Relation Age of Onset  . Diabetes Father   . Colon cancer Neg Hx   . Colon polyps Neg Hx   . Esophageal cancer Neg Hx   . Rectal cancer Neg Hx   . Stomach cancer Neg Hx     Social History   Socioeconomic History  . Marital status: Single    Spouse name: Not on file  . Number of children: Not on file  . Years of education: Not on file  . Highest education level: Not on file  Occupational History  . Not on file  Tobacco Use  .  Smoking status: Never Smoker  . Smokeless tobacco: Never Used  Vaping Use  . Vaping Use: Never used  Substance and Sexual Activity  . Alcohol use: No  . Drug use: No  . Sexual activity: Not on file  Other Topics Concern  . Not on file  Social History Narrative  . Not on file   Social Determinants of Health   Financial Resource Strain: Not on file  Food Insecurity: Not on file  Transportation Needs: Not on file  Physical Activity: Not on file  Stress: Not on file  Social Connections: Not on file  Intimate Partner Violence: Not on file    Outpatient Medications Prior to Visit  Medication Sig Dispense Refill  . amLODipine (NORVASC) 10 MG tablet Take 1 tablet (10 mg total) by mouth daily. 90 tablet 3  . acetaminophen (TYLENOL) 500 MG tablet Take by mouth. (Patient not taking: Reported on 11/23/2020)     No facility-administered medications prior to visit.    No Known Allergies  ROS Review of Systems    Objective:    Physical Exam Constitutional:      Appearance: Normal appearance.  HENT:     Head: Normocephalic and atraumatic.     Nose: Nose normal.     Mouth/Throat:     Mouth: Mucous membranes are moist.  Cardiovascular:  Rate and Rhythm: Normal rate and regular rhythm.     Pulses: Normal pulses.     Heart sounds: Normal heart sounds.  Pulmonary:     Effort: Pulmonary effort is normal.     Breath sounds: Normal breath sounds.  Abdominal:     Palpations: Abdomen is soft.  Musculoskeletal:        General: Deformity: 2+     Cervical back: Normal range of motion.     Right lower leg: Edema (2) present.     Left lower leg: Edema (1+) present.  Skin:    General: Skin is warm and dry.     Capillary Refill: Capillary refill takes less than 2 seconds.  Neurological:     General: No focal deficit present.     Mental Status: He is alert and oriented to person, place, and time.  Psychiatric:        Mood and Affect: Mood normal.        Behavior: Behavior  normal.        Thought Content: Thought content normal.        Judgment: Judgment normal.     BP 139/67 (BP Location: Left Arm, Patient Position: Sitting, Cuff Size: Large)   Pulse 63   Temp 98.6 F (37 C) (Temporal)   Ht 5' 6"  (1.676 m)   Wt 177 lb (80.3 kg)   SpO2 95%   BMI 28.57 kg/m  Wt Readings from Last 3 Encounters:  11/23/20 177 lb (80.3 kg)  08/22/20 176 lb 9.6 oz (80.1 kg)  06/03/20 175 lb (79.4 kg)     There are no preventive care reminders to display for this patient.  There are no preventive care reminders to display for this patient.  Lab Results  Component Value Date   TSH 2.770 10/28/2019   Lab Results  Component Value Date   WBC 4.1 12/30/2019   HGB 15.2 12/30/2019   HCT 45.1 12/30/2019   MCV 88 12/30/2019   PLT 198 12/30/2019   Lab Results  Component Value Date   NA 147 (H) 11/23/2020   K 4.1 11/23/2020   CO2 24 08/09/2013   GLUCOSE 101 (H) 11/23/2020   BUN 9 11/23/2020   CREATININE 0.90 11/23/2020   BILITOT 0.2 11/23/2020   ALKPHOS 111 11/23/2020   AST 16 11/23/2020   ALT 17 08/09/2013   PROT 7.1 11/23/2020   ALBUMIN 4.1 11/23/2020   CALCIUM 8.7 11/23/2020   EGFR 95 11/23/2020   Lab Results  Component Value Date   CHOL 199 11/02/2019   Lab Results  Component Value Date   HDL 62 11/02/2019   Lab Results  Component Value Date   LDLCALC 111 (H) 11/02/2019   Lab Results  Component Value Date   TRIG 151 (H) 11/02/2019   Lab Results  Component Value Date   CHOLHDL 3.2 11/02/2019   No results found for: HGBA1C    Assessment & Plan:   Problem List Items Addressed This Visit      Cardiovascular and Mediastinum   Essential hypertension - Primary Stable  Encouraged on going compliance with current medication regimen Encouraged home monitoring and recording BP <130/80 Eating a heart-healthy diet with less salt Encouraged regular physical activity  Recommend Weight loss   Relevant Medications   furosemide (LASIX) 20 MG  tablet   Other Relevant Orders   Comp. Metabolic Panel (12) (Completed)    Other Visit Diagnoses    Localized edema     Wprsening  Furosemide  20 mg qd If edema does not resolve call for further evaluation Follow up with surgery sooner    Relevant Orders   Brain natriuretic peptide (Completed)      Meds ordered this encounter  Medications  . furosemide (LASIX) 20 MG tablet    Sig: Take 1 tablet (20 mg total) by mouth daily for 5 days.    Dispense:  5 tablet    Refill:  0    Order Specific Question:   Supervising Provider    Answer:   Tresa Garter [id number]    Follow-up: Return in 3 months (on 02/22/2021) for Follow up HTN [id number].    Vevelyn Francois, NP

## 2020-11-24 LAB — COMP. METABOLIC PANEL (12)
AST: 16 IU/L (ref 0–40)
Albumin/Globulin Ratio: 1.4 (ref 1.2–2.2)
Albumin: 4.1 g/dL (ref 3.8–4.8)
Alkaline Phosphatase: 111 IU/L (ref 44–121)
BUN/Creatinine Ratio: 10 (ref 10–24)
BUN: 9 mg/dL (ref 8–27)
Bilirubin Total: 0.2 mg/dL (ref 0.0–1.2)
Calcium: 8.7 mg/dL (ref 8.6–10.2)
Chloride: 108 mmol/L — ABNORMAL HIGH (ref 96–106)
Creatinine, Ser: 0.9 mg/dL (ref 0.76–1.27)
Globulin, Total: 3 g/dL (ref 1.5–4.5)
Glucose: 101 mg/dL — ABNORMAL HIGH (ref 65–99)
Potassium: 4.1 mmol/L (ref 3.5–5.2)
Sodium: 147 mmol/L — ABNORMAL HIGH (ref 134–144)
Total Protein: 7.1 g/dL (ref 6.0–8.5)
eGFR: 95 mL/min/{1.73_m2} (ref >59)

## 2020-11-24 LAB — BRAIN NATRIURETIC PEPTIDE: BNP: 33.7 pg/mL (ref 0.0–100.0)

## 2020-11-25 DIAGNOSIS — T8451XD Infection and inflammatory reaction due to internal right hip prosthesis, subsequent encounter: Secondary | ICD-10-CM | POA: Diagnosis not present

## 2020-11-25 DIAGNOSIS — Z9889 Other specified postprocedural states: Secondary | ICD-10-CM | POA: Diagnosis not present

## 2020-11-25 DIAGNOSIS — I10 Essential (primary) hypertension: Secondary | ICD-10-CM | POA: Diagnosis not present

## 2020-11-25 DIAGNOSIS — Z87891 Personal history of nicotine dependence: Secondary | ICD-10-CM | POA: Diagnosis not present

## 2020-11-27 ENCOUNTER — Emergency Department (HOSPITAL_COMMUNITY)
Admission: EM | Admit: 2020-11-27 | Discharge: 2020-11-27 | Disposition: A | Discharge status: Home / Self Care | Payer: Medicare HMO | Attending: Emergency Medicine | Admitting: Emergency Medicine

## 2020-11-27 ENCOUNTER — Encounter (HOSPITAL_COMMUNITY): Payer: Self-pay

## 2020-11-27 ENCOUNTER — Emergency Department (HOSPITAL_COMMUNITY): Payer: Medicare HMO

## 2020-11-27 DIAGNOSIS — Z79899 Other long term (current) drug therapy: Secondary | ICD-10-CM | POA: Diagnosis not present

## 2020-11-27 DIAGNOSIS — Z96641 Presence of right artificial hip joint: Secondary | ICD-10-CM | POA: Insufficient documentation

## 2020-11-27 DIAGNOSIS — R1011 Right upper quadrant pain: Secondary | ICD-10-CM | POA: Diagnosis not present

## 2020-11-27 DIAGNOSIS — R109 Unspecified abdominal pain: Secondary | ICD-10-CM | POA: Diagnosis not present

## 2020-11-27 DIAGNOSIS — I10 Essential (primary) hypertension: Secondary | ICD-10-CM | POA: Insufficient documentation

## 2020-11-27 LAB — COMPREHENSIVE METABOLIC PANEL
ALT: 13 U/L (ref 0–44)
AST: 19 U/L (ref 15–41)
Albumin: 3.9 g/dL (ref 3.5–5.0)
Alkaline Phosphatase: 90 U/L (ref 38–126)
Anion gap: 8 (ref 5–15)
BUN: 16 mg/dL (ref 8–23)
CO2: 28 mmol/L (ref 22–32)
Calcium: 9.3 mg/dL (ref 8.9–10.3)
Chloride: 103 mmol/L (ref 98–111)
Creatinine, Ser: 1 mg/dL (ref 0.61–1.24)
GFR, Estimated: >60 mL/min (ref >60)
Glucose, Bld: 104 mg/dL — ABNORMAL HIGH (ref 70–99)
Potassium: 4.3 mmol/L (ref 3.5–5.1)
Sodium: 139 mmol/L (ref 135–145)
Total Bilirubin: 0.5 mg/dL (ref 0.3–1.2)
Total Protein: 7.9 g/dL (ref 6.5–8.1)

## 2020-11-27 LAB — URINALYSIS, ROUTINE W REFLEX MICROSCOPIC
Bilirubin Urine: NEGATIVE
Glucose, UA: NEGATIVE mg/dL
Hgb urine dipstick: NEGATIVE
Ketones, ur: NEGATIVE mg/dL
Leukocytes,Ua: NEGATIVE
Nitrite: NEGATIVE
Protein, ur: NEGATIVE mg/dL
Specific Gravity, Urine: 1.013 (ref 1.005–1.030)
pH: 8 (ref 5.0–8.0)

## 2020-11-27 LAB — CBC
HCT: 42.6 % (ref 39.0–52.0)
Hemoglobin: 12.2 g/dL — ABNORMAL LOW (ref 13.0–17.0)
MCH: 23.1 pg — ABNORMAL LOW (ref 26.0–34.0)
MCHC: 28.6 g/dL — ABNORMAL LOW (ref 30.0–36.0)
MCV: 80.8 fL (ref 80.0–100.0)
Platelets: 251 10*3/uL (ref 150–400)
RBC: 5.27 MIL/uL (ref 4.22–5.81)
RDW: 18.8 % — ABNORMAL HIGH (ref 11.5–15.5)
WBC: 5.9 10*3/uL (ref 4.0–10.5)
nRBC: 0 % (ref 0.0–0.2)

## 2020-11-27 LAB — LIPASE, BLOOD: Lipase: 36 U/L (ref 11–51)

## 2020-11-27 MED ORDER — ONDANSETRON HCL 4 MG/2ML IJ SOLN
4.0000 mg | Freq: Once | INTRAMUSCULAR | Status: AC
  Administered 2020-11-27: 4 mg via INTRAVENOUS
  Filled 2020-11-27: qty 2

## 2020-11-27 MED ORDER — IOHEXOL 300 MG/ML  SOLN
100.0000 mL | Freq: Once | INTRAMUSCULAR | Status: AC | PRN
  Administered 2020-11-27: 100 mL via INTRAVENOUS

## 2020-11-27 MED ORDER — MORPHINE SULFATE (PF) 4 MG/ML IV SOLN
4.0000 mg | Freq: Once | INTRAVENOUS | Status: AC
Start: 2020-11-27 — End: 2020-11-27
  Administered 2020-11-27: 4 mg via INTRAVENOUS
  Filled 2020-11-27: qty 1

## 2020-11-27 MED ORDER — MELOXICAM 7.5 MG PO TABS: 7.5000 mg | ORAL_TABLET | Freq: Every day | ORAL | 0 refills | Status: DC

## 2020-11-27 NOTE — ED Triage Notes (Signed)
Patient reports severe RUQ pain, denies N/V/D, also with cough

## 2020-11-27 NOTE — ED Notes (Signed)
Patient transported to Ultrasound 

## 2020-11-27 NOTE — ED Provider Notes (Signed)
[facility] EMERGENCY DEPARTMENT Provider Note   CSN: [id number] Arrival date & time: 11/27/20  0244     History Chief Complaint  Patient presents with  . Abdominal Pain    Darren Sherman is a 65 y.o. male.  Patient presents to the emergency department for evaluation of upper abdominal pain.  Patient reports pain predominantly on the right side of his abdomen but across the upper abdomen without nausea, vomiting or diarrhea.        Past Medical History:  Diagnosis Date  . Hypertension     Patient Active Problem List   Diagnosis Date Noted  . Acute postoperative pain 06/22/2020  . 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 Essential hypertension 03/24/2020  . History of total right hip replacement 02/15/2020  . Primary osteoarthritis of right hip 02/15/2020  . Unilateral primary osteoarthritis, left knee 06/28/2016    Past Surgical History:  Procedure Laterality Date  . APPENDECTOMY    . COLONOSCOPY  06/03/2020  . MASS EXCISION  09/2019  . TOTAL HIP ARTHROPLASTY         Family History  Problem Relation Age of Onset  . Diabetes Father   . Colon cancer Neg Hx   . Colon polyps Neg Hx   . Esophageal cancer Neg Hx   . Rectal cancer Neg Hx   . Stomach cancer Neg Hx     Social History   Tobacco Use  . Smoking status: Never Smoker  . Smokeless tobacco: Never Used  Vaping Use  . Vaping Use: Never used  Substance Use Topics  . Alcohol use: No  . Drug use: No    Home Medications Prior to Admission medications   Medication Sig Start Date End Date Taking? Authorizing Provider  amLODipine (NORVASC) 10 MG tablet Take 1 tablet (10 mg total) by mouth daily. 08/22/20  Yes Barbette Merino, NP  furosemide (LASIX) 20 MG tablet Take 1 tablet (20 mg total) by mouth daily for 5 days. 11/23/20 11/28/20 Yes King, Shana Chute, NP  loratadine (CLARITIN) 10 MG tablet Take 10 mg by mouth daily as needed for allergies.   Yes [provider]  meloxicam (MOBIC) 7.5 MG tablet Take 1 tablet (7.5 mg total) by mouth daily. 11/27/20  Yes Raymone Pembroke, Canary Brim, MD    Allergies    Patient has no known allergies.  Review of Systems   Review of Systems  Gastrointestinal: Positive for abdominal pain.  All other systems reviewed and are negative.   Physical Exam Updated Vital Signs BP (!) 151/91   Pulse 64   Temp 98.2 F (36.8 C) (Oral)   Resp 13   Ht 5\' 6"  (1.676 m)   Wt 80.3 kg   SpO2 91%   BMI 28.57 kg/m   Physical Exam Vitals and nursing note reviewed.  Constitutional:      General: He is not in acute distress.    Appearance: Normal appearance. He is well-developed.  HENT:     Head: Normocephalic and atraumatic.     Right Ear: Hearing normal.     Left Ear: Hearing normal.     Nose: Nose normal.  Eyes:     Conjunctiva/sclera: Conjunctivae normal.     Pupils: Pupils are equal, round, and reactive to light.  Cardiovascular:     Rate and Rhythm: Regular rhythm.     Heart sounds: S1 normal and S2 normal. No murmur heard. No friction rub. No gallop.   Pulmonary:  Effort: Pulmonary effort is normal. No respiratory distress.     Breath sounds: Normal breath sounds.  Chest:     Chest wall: Tenderness present.    Abdominal:     General: Abdomen is protuberant. Bowel sounds are normal. There is distension.     Palpations: Abdomen is soft.     Tenderness: There is abdominal tenderness in the right upper quadrant. There is no guarding or rebound. Negative signs include Murphy's sign and McBurney's sign.     Hernia: No hernia is present.  Musculoskeletal:        General: Normal range of motion.     Cervical back: Normal range of motion and neck supple.  Skin:    General: Skin is warm and dry.     Findings: No rash.  Neurological:     Mental Status: He is alert and oriented to person, place, and time.     GCS: GCS eye subscore is 4. GCS verbal subscore is 5. GCS motor subscore is 6.     Cranial Nerves: No  cranial nerve deficit.     Sensory: No sensory deficit.     Coordination: Coordination normal.  Psychiatric:        Speech: Speech normal.        Behavior: Behavior normal.        Thought Content: Thought content normal.     ED Results / Procedures / Treatments   Labs (all labs ordered are listed, but only abnormal results are displayed) Labs Reviewed  COMPREHENSIVE METABOLIC PANEL - Abnormal; Notable for the following components:      Result Value   Glucose, Bld 104 (*)    All other components within normal limits  CBC - Abnormal; Notable for the following components:   Hemoglobin 12.2 (*)    MCH 23.1 (*)    MCHC 28.6 (*)    RDW 18.8 (*)    All other components within normal limits  URINALYSIS, ROUTINE W REFLEX MICROSCOPIC - Abnormal; Notable for the following components:   Color, Urine STRAW (*)    All other components within normal limits  LIPASE, BLOOD    EKG EKG Interpretation  Date/Time:  Sunday Nov 27 2020 03:18:26 EDT Ventricular Rate:  71 PR Interval:  166 QRS Duration: 85 QT Interval:  390 QTC Calculation: 424 R Axis:   67 Text Interpretation: Sinus arrhythmia Abnormal R-wave progression, late transition Confirmed by Gilda Crease [phone]) on 11/27/2020 3:47:57 AM   Radiology CT ABDOMEN PELVIS W CONTRAST  Result Date: 11/27/2020 CLINICAL DATA:  65 year old male with abdominal pain and distension. Right upper quadrant pain. EXAM: CT ABDOMEN AND PELVIS WITH CONTRAST TECHNIQUE: Multidetector CT imaging of the abdomen and pelvis was performed using the standard protocol following bolus administration of intravenous contrast. CONTRAST:  OMNIPAQUE IOHEXOL 300 MG/ML  SOLN COMPARISON:  None. FINDINGS: Lower chest: Mildly elevated right hemidiaphragm. Minor lung base atelectasis. Mild cardiomegaly. No pericardial or pleural effusion. Hepatobiliary: Normal CT appearance of the liver and gallbladder. No bile duct enlargement. Pancreas: Negative. Spleen:  Negative. Adrenals/Urinary Tract: Normal adrenal glands. Nonobstructed kidneys with symmetric renal enhancement and contrast excretion. Benign left renal lower pole parapelvic cyst. Negative ureters. Unremarkable bladder. No urinary calculus identified. Stomach/Bowel: Decompressed and negative large bowel from the descending colon to the rectum. Low-density retained stool in the proximal large bowel with redundant transverse colon. Diminutive or absent appendix. No pericecal inflammation. Negative terminal ileum. No dilated small bowel. Decompressed stomach and duodenum. No gastro duodenal inflammation  identified. No free air, free fluid. Vascular/Lymphatic: Aortoiliac calcified atherosclerosis. Mildly tortuous iliac arteries. Major arterial structures remain patent. Portal venous system is patent. No lymphadenopathy. Reproductive: Small fat containing left inguinal hernia. Other: No pelvic free fluid. Musculoskeletal: Mild vacuum disc at the lumbosacral junction. Previous right hip arthroplasty with pronounced lucency along the proximal femoral component (series 3, image 83). No other No acute osseous abnormality identified. IMPRESSION: 1. No acute or inflammatory process identified in the abdomen or pelvis. 2. Previous right hip arthroplasty with abnormal loosening of the femoral component. Consider particle disease (AKA aggressive granulomatosis). 3.  Aortic Atherosclerosis (ICD10-I70.0). Electronically Signed   By: Odessa Fleming M.D.   On: 11/27/2020 05:10   US ABDOMEN LIMITED RUQ (LIVER/GB)  Result Date: 11/27/2020 CLINICAL DATA:  Upper abdominal pain since last night EXAM: ULTRASOUND ABDOMEN LIMITED RIGHT UPPER QUADRANT COMPARISON:  CT 11/27/2020 FINDINGS: Gallbladder: No gallstones or wall thickening visualized. No sonographic Murphy sign noted by sonographer. Common bile duct: Diameter: 4.7 mm, non dilated Liver: No focal lesion identified. Within normal limits in parenchymal echogenicity. Smooth liver surface  contour. No intrahepatic biliary ductal dilatation. Portal vein is patent on color Doppler imaging with normal direction of blood flow towards the liver. Other: None. IMPRESSION: Unremarkable right upper quadrant ultrasound. Electronically Signed   By: Kreg Shropshire M.D.   On: 11/27/2020 06:51    Procedures Procedures   Medications Ordered in ED Medications  morphine 4 MG/ML injection 4 mg (4 mg Intravenous Given 11/27/20 0503)  ondansetron (ZOFRAN) injection 4 mg (4 mg Intravenous Given 11/27/20 0416)  iohexol (OMNIPAQUE) 300 MG/ML solution 100 mL (100 mLs Intravenous Contrast Given 11/27/20 0431)    ED Course  I have reviewed the triage vital signs and the nursing notes.  Pertinent labs & imaging results that were available during my care of the patient were reviewed by me and considered in my medical decision making (see chart for details).    MDM Rules/Calculators/A&P                          Patient presents to the emergency department for evaluation of abdominal pain.  Examination reveals protuberant and mildly tympanitic abdomen.  He has generalized tenderness without guarding or rebound.  Tenderness is mostly in the upper abdomen and predominantly on the right.  This does extend up into the right chest wall area as well.  Lab work is unremarkable.  CT scan without acute abnormality.  Right upper quadrant ultrasound also negative.  Work-up has been reassuring.  Suspect pain is musculoskeletal in nature.  Discharged with analgesia, follow-up with primary care.  Final Clinical Impression(s) / ED Diagnoses Final diagnoses:  Right upper quadrant abdominal pain    Rx / DC Orders ED Discharge Orders         Ordered    meloxicam (MOBIC) 7.5 MG tablet  Daily        11/27/20 0709           Gilda Crease, MD 11/27/20 [phone]

## 2020-11-27 NOTE — ED Notes (Signed)
Patient Alert and oriented to baseline. Stable and ambulatory to baseline. Patient verbalized understanding of the discharge instructions.  Patient belongings were taken by the patient.   

## 2020-12-02 ENCOUNTER — Encounter (HOSPITAL_COMMUNITY): Payer: Self-pay

## 2020-12-02 ENCOUNTER — Emergency Department (HOSPITAL_COMMUNITY): Payer: Medicare HMO

## 2020-12-02 ENCOUNTER — Other Ambulatory Visit: Payer: Self-pay

## 2020-12-02 ENCOUNTER — Emergency Department (HOSPITAL_COMMUNITY)
Admission: EM | Admit: 2020-12-02 | Discharge: 2020-12-02 | Disposition: A | Discharge status: Home / Self Care | Payer: Medicare HMO | Attending: Emergency Medicine | Admitting: Emergency Medicine

## 2020-12-02 DIAGNOSIS — I10 Essential (primary) hypertension: Secondary | ICD-10-CM | POA: Diagnosis not present

## 2020-12-02 DIAGNOSIS — M25552 Pain in left hip: Secondary | ICD-10-CM | POA: Diagnosis not present

## 2020-12-02 DIAGNOSIS — Z041 Encounter for examination and observation following transport accident: Secondary | ICD-10-CM | POA: Diagnosis not present

## 2020-12-02 DIAGNOSIS — Y92410 Unspecified street and highway as the place of occurrence of the external cause: Secondary | ICD-10-CM | POA: Diagnosis not present

## 2020-12-02 DIAGNOSIS — Z79899 Other long term (current) drug therapy: Secondary | ICD-10-CM | POA: Insufficient documentation

## 2020-12-02 DIAGNOSIS — Z96641 Presence of right artificial hip joint: Secondary | ICD-10-CM | POA: Insufficient documentation
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
## 2020-12-02 NOTE — Discharge Instructions (Signed)
Your x-ray today was reassuring.  We recommend Tylenol or ibuprofen for management of pain.  Follow-up with your primary doctor for recheck.  You may return for new or concerning symptoms.

## 2020-12-15 ENCOUNTER — Ambulatory Visit: Payer: Self-pay | Admitting: Nurse Practitioner

## 2020-12-16 ENCOUNTER — Telehealth (INDEPENDENT_AMBULATORY_CARE_PROVIDER_SITE_OTHER): Payer: Self-pay | Admitting: Nurse Practitioner

## 2020-12-16 DIAGNOSIS — M25552 Pain in left hip: Secondary | ICD-10-CM

## 2020-12-16 NOTE — Progress Notes (Signed)
   [facility] Patient [facility] [address] [place], [place]  [address] Phone:  [phone]   Fax:  [phone]  Encounter Note - Virtual Visit via Telephone Telehealth (real-time audio visits between healthcare provider and patient).  Patient's Phone No. & Preferred Pharmacy:  [phone] (home); [phone] (mobile); (Preferred) [phone]  [id number] - [place]), [place] - [address] [place] ([place]) [place] [address] Phone: [phone] Fax: [phone]   Pre-screening note:  Our staff contacted Mr. Junious and offered him an "in person", "face-to-face" appointment versus a telephone encounter. He indicated preferring the telephone encounter, at this time.  Reason for Virtual Visit: COVID-19*  Social distancing based on CDC and AMA recommendations.   I initiated contact with Gloriajean Dell on 12/16/2020 by telephone, but I got no answer. I left a message to reschedule the call.and clearly identified myself as Barbette Merino, NP. I verified that I was speaking with the correct person using two identifiers (Name and date of birth: Jun 28, 1956).

## 2021-01-02 ENCOUNTER — Telehealth (INDEPENDENT_AMBULATORY_CARE_PROVIDER_SITE_OTHER): Payer: Medicare HMO | Admitting: Nurse Practitioner

## 2021-01-02 ENCOUNTER — Encounter: Payer: Self-pay | Admitting: Nurse Practitioner

## 2021-01-02 ENCOUNTER — Other Ambulatory Visit: Payer: Self-pay

## 2021-01-02 DIAGNOSIS — M25552 Pain in left hip: Secondary | ICD-10-CM | POA: Diagnosis not present

## 2021-01-02 DIAGNOSIS — Z96641 Presence of right artificial hip joint: Secondary | ICD-10-CM
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
## 2021-01-02 NOTE — Patient Instructions (Signed)
Hip Pain The hip is the joint between the upper legs and the lower pelvis. The bones, cartilage, tendons, and muscles of your hip joint support your body and allow you to move around. Hip pain can range from a minor ache to severe pain in one or both of your hips. The pain may be felt on the inside of the hip joint near the groin, or on the outside near the buttocks and upper thigh. You may also have swelling or stiffness in your hip area. Follow these instructions at home: Managing pain, stiffness, and swelling  If directed, put ice on the painful area. To do this: ? Put ice in a plastic bag. ? Place a towel between your skin and the bag. ? Leave the ice on for 20 minutes, 2-3 times a day.  If directed, apply heat to the affected area as often as told by your health care provider. Use the heat source that your health care provider recommends, such as a moist heat pack or a heating pad. ? Place a towel between your skin and the heat source. ? Leave the heat on for 20-30 minutes. ? Remove the heat if your skin turns bright red. This is especially important if you are unable to feel pain, heat, or cold. You may have a greater risk of getting burned.      Activity  Do exercises as told by your health care provider.  Avoid activities that cause pain. General instructions  Take over-the-counter and prescription medicines only as told by your health care provider.  Keep a journal of your symptoms. Write down: ? How often you have hip pain. ? The location of your pain. ? What the pain feels like. ? What makes the pain worse.  Sleep with a pillow between your legs on your most comfortable side.  Keep all follow-up visits as told by your health care provider. This is important.   Contact a health care provider if:  You cannot put weight on your leg.  Your pain or swelling continues or gets worse after one week.  It gets harder to walk.  You have a fever. Get help right away  if:  You fall.  You have a sudden increase in pain and swelling in your hip.  Your hip is red or swollen or very tender to touch. Summary  Hip pain can range from a minor ache to severe pain in one or both of your hips.  The pain may be felt on the inside of the hip joint near the groin, or on the outside near the buttocks and upper thigh.  Avoid activities that cause pain.  Write down how often you have hip pain, the location of the pain, what makes it worse, and what it feels like. This information is not intended to replace advice given to you by your health care provider. Make sure you discuss any questions you have with your health care provider. Document Revised: 12/01/2018 Document Reviewed: 12/01/2018 Elsevier Patient Education  2021 [employer]  

## 2021-01-09 ENCOUNTER — Ambulatory Visit (INDEPENDENT_AMBULATORY_CARE_PROVIDER_SITE_OTHER): Payer: Medicare HMO | Admitting: Nurse Practitioner

## 2021-01-09 VITALS — BP 151/90 | HR 65 | Temp 97.8°F | Resp 18 | Ht 66.0 in | Wt 175.0 lb

## 2021-01-09 DIAGNOSIS — Z122 Encounter for screening for malignant neoplasm of respiratory organs: Secondary | ICD-10-CM

## 2021-01-09 DIAGNOSIS — Z0000 Encounter for general adult medical examination without abnormal findings: Secondary | ICD-10-CM

## 2021-01-09 MED ORDER — AMLODIPINE BESYLATE 10 MG PO TABS: 10.0000 mg | ORAL_TABLET | Freq: Every day | ORAL | 0 refills | Status: DC

## 2021-01-09 NOTE — Patient Instructions (Addendum)
[name] , Thank you for taking time to come for your Medicare Wellness Visit. I appreciate your ongoing commitment to your health goals. Please review the following plan we discussed and let me know if I can assist you in the future.   These are the goals we discussed:  Goals     . DIET - EAT MORE FRUITS AND VEGETABLES        This is a list of the screening recommended for you and due dates:  Health Maintenance  Topic Date Due  . Zoster (Shingles) Vaccine (1 of 2) Never done  . COVID-19 Vaccine (4 - Booster for Pfizer series) 07/27/2020  . HIV Screening  03/24/2021*  . Flu Shot  02/27/2021  . Pneumonia vaccines (2 of 2 - PPSV23) 08/22/2021  . Tetanus Vaccine  10/26/2026  . Colon Cancer Screening  06/03/2030  . Hepatitis C Screening: USPSTF Recommendation to screen - Ages 218-79 yo.  Completed  . HPV Vaccine  Aged Out  *Topic was postponed. The date shown is not the original due date.    Fall Prevention in the Home, Adult Falls can cause injuries and can happen to people of all ages. There are many things you can do to make your home safe and to help prevent falls. Ask forhelp when making these changes. What actions can I take to prevent falls? General Instructions Use good lighting in all rooms. Replace any light bulbs that burn out. Turn on the lights in dark areas. Use night-lights. Keep items that you use often in easy-to-reach places. Lower the shelves around your home if needed. Set up your furniture so you have a clear path. Avoid moving your furniture around. Do not have throw rugs or other things on the floor that can make you trip. Avoid walking on wet floors. If any of your floors are uneven, fix them. Add color or contrast paint or tape to clearly mark and help you see: Grab bars or handrails. First and last steps of staircases. Where the edge of each step is. If you use a stepladder: Make sure that it is fully opened. Do not climb a closed stepladder. Make  sure the sides of the stepladder are locked in place. Ask someone to hold the stepladder while you use it. Know where your pets are when moving through your home. What can I do in the bathroom?     Keep the floor dry. Clean up any water on the floor right away. Remove soap buildup in the tub or shower. Use nonskid mats or decals on the floor of the tub or shower. Attach bath mats securely with double-sided, nonslip rug tape. If you need to sit down in the shower, use a plastic, nonslip stool. Install grab bars by the toilet and in the tub and shower. Do not use towel bars as grab bars. What can I do in the bedroom? Make sure that you have a light by your bed that is easy to reach. Do not use any sheets or blankets for your bed that hang to the floor. Have a firm chair with side arms that you can use for support when you get dressed. What can I do in the kitchen? Clean up any spills right away. If you need to reach something above you, use a step stool with a grab bar. Keep electrical cords out of the way. Do not use floor polish or wax that makes floors slippery. What can I do with my stairs? Do not  leave any items on the stairs. Make sure that you have a light switch at the top and the bottom of the stairs. Make sure that there are handrails on both sides of the stairs. Fix handrails that are broken or loose. Install nonslip stair treads on all your stairs. Avoid having throw rugs at the top or bottom of the stairs. Choose a carpet that does not hide the edge of the steps on the stairs. Check carpeting to make sure that it is firmly attached to the stairs. Fix carpet that is loose or worn. What can I do on the outside of my home? Use bright outdoor lighting. Fix the edges of walkways and driveways and fix any cracks. Remove anything that might make you trip as you walk through a door, such as a raised step or threshold. Trim any bushes or trees on paths to your home. Check to see  if handrails are loose or broken and that both sides of all steps have handrails. Install guardrails along the edges of any raised decks and porches. Clear paths of anything that can make you trip, such as tools or rocks. Have leaves, snow, or ice cleared regularly. Use sand or salt on paths during winter. Clean up any spills in your garage right away. This includes grease or oil spills. What other actions can I take? Wear shoes that: Have a low heel. Do not wear high heels. Have rubber bottoms. Feel good on your feet and fit well. Are closed at the toe. Do not wear open-toe sandals. Use tools that help you move around if needed. These include: Canes. Walkers. Scooters. Crutches. Review your medicines with your doctor. Some medicines can make you feel dizzy. This can increase your chance of falling. Ask your doctor what else you can do to help prevent falls. Where to find more information Centers for Disease Control and Prevention, [employer]: FootballExhibition.com.br [employer] on Aging: https://walker.com/ Contact a doctor if: You are afraid of falling at home. You feel weak, drowsy, or dizzy at home. You fall at home. Summary There are many simple things that you can do to make your home safe and to help prevent falls. Ways to make your home safe include removing things that can make you trip and installing grab bars in the bathroom. Ask for help when making these changes in your home. This information is not intended to replace advice given to you by your health care provider. Make sure you discuss any questions you have with your healthcare provider. Document Revised: 02/17/2020 Document Reviewed: 02/17/2020 Elsevier Patient Education  2022 Else[employer] Inc.  Health Maintenance, Male Adopting a healthy lifestyle and getting preventive care are important in promoting health and wellness. Ask your health care provider about: The right schedule for you to have regular tests and exams. Things  you can do on your own to prevent diseases and keep yourself healthy. What should I know about diet, weight, and exercise? Eat a healthy diet  Eat a diet that includes plenty of vegetables, fruits, low-fat dairy products, and lean protein. Do not eat a lot of foods that are high in solid fats, added sugars, or sodium.  Maintain a healthy weight Body mass index (BMI) is a measurement that can be used to identify possible weight problems. It estimates body fat based on height and weight. Your health care provider can help determine your BMI and help you achieve or maintain ahealthy weight. Get regular exercise Get regular exercise. This is one of the  most important things you can do for your health. Most adults should: Exercise for at least 150 minutes each week. The exercise should increase your heart rate and make you sweat (moderate-intensity exercise). Do strengthening exercises at least twice a week. This is in addition to the moderate-intensity exercise. Spend less time sitting. Even light physical activity can be beneficial. Watch cholesterol and blood lipids Have your blood tested for lipids and cholesterol at 66 years of age, then havethis test every 5 years. You may need to have your cholesterol levels checked more often if: Your lipid or cholesterol levels are high. You are older than 66 years of age. You are at high risk for heart disease. What should I know about cancer screening? Many types of cancers can be detected early and may often be prevented. Depending on your health history and family history, you may need to have cancer screening at various ages. This may include screening for: Colorectal cancer. Prostate cancer. Skin cancer. Lung cancer. What should I know about heart disease, diabetes, and high blood pressure? Blood pressure and heart disease High blood pressure causes heart disease and increases the risk of stroke. This is more likely to develop in people who have  high blood pressure readings, are of African descent, or are overweight. Talk with your health care provider about your target blood pressure readings. Have your blood pressure checked: Every 3-5 years if you are 14-10 years of age. Every year if you are 65 years old or older. If you are between the ages of 20 and 73 and are a current or former smoker, ask your health care provider if you should have a one-time screening for abdominal aortic aneurysm (AAA). Diabetes Have regular diabetes screenings. This checks your fasting blood sugar level. Have the screening done: Once every three years after age 51 if you are at a normal weight and have a low risk for diabetes. More often and at a younger age if you are overweight or have a high risk for diabetes. What should I know about preventing infection? Hepatitis B If you have a higher risk for hepatitis B, you should be screened for this virus. Talk with your health care provider to find out if you are at risk forhepatitis B infection. Hepatitis C Blood testing is recommended for: Everyone born from [birth year] through 1966. Anyone with known risk factors for hepatitis C. Sexually transmitted infections (STIs) You should be screened each year for STIs, including gonorrhea and chlamydia, if: You are sexually active and are younger than 66 years of age. You are older than 66 years of age and your health care provider tells you that you are at risk for this type of infection. Your sexual activity has changed since you were last screened, and you are at increased risk for chlamydia or gonorrhea. Ask your health care provider if you are at risk. Ask your health care provider about whether you are at high risk for HIV. Your health care provider may recommend a prescription medicine to help prevent HIV infection. If you choose to take medicine to prevent HIV, you should first get tested for HIV. You should then be tested every 3 months for as long as you are  taking the medicine. Follow these instructions at home: Lifestyle Do not use any products that contain nicotine or tobacco, such as cigarettes, e-cigarettes, and chewing tobacco. If you need help quitting, ask your health care provider. Do not use street drugs. Do not share needles. Ask your  health care provider for help if you need support or information about quitting drugs. Alcohol use Do not drink alcohol if your health care provider tells you not to drink. If you drink alcohol: Limit how much you have to 0-2 drinks a day. Be aware of how much alcohol is in your drink. In the [place] one drink equals one 12 oz bottle of beer (355 mL), one 5 oz glass of wine (148 mL), or one 1 oz glass of hard liquor (44 mL). General instructions Schedule regular health, dental, and eye exams. Stay current with your vaccines. Tell your health care provider if: You often feel depressed. You have ever been abused or do not feel safe at home. Summary Adopting a healthy lifestyle and getting preventive care are important in promoting health and wellness. Follow your health care provider's instructions about healthy diet, exercising, and getting tested or screened for diseases. Follow your health care provider's instructions on monitoring your cholesterol and blood pressure. This information is not intended to replace advice given to you by your health care provider. Make sure you discuss any questions you have with your healthcare provider. Document Revised: 07/09/2018 Document Reviewed: 07/09/2018 Elsevier Patient Education  2022 Elsevier Inc.  Steps to Quit Smoking Smoking tobacco is the leading cause of preventable death. It can affect almost every organ in the body. Smoking puts you and people around you at risk for many serious, long-lasting (chronic) diseases. Quitting smoking can be hard, but it is one of the best things thatyou can do for your health. It is never too late to quit. How do I get  ready to quit? When you decide to quit smoking, make a plan to help you succeed. Before you quit: Pick a date to quit. Set a date within the next 2 weeks to give you time to prepare. Write down the reasons why you are quitting. Keep this list in places where you will see it often. Tell your family, friends, and co-workers that you are quitting. Their support is important. Talk with your doctor about the choices that may help you quit. Find out if your health insurance will pay for these treatments. Know the people, places, things, and activities that make you want to smoke (triggers). Avoid them. What first steps can I take to quit smoking? Throw away all cigarettes at home, at work, and in your car. Throw away the things that you use when you smoke, such as ashtrays and lighters. Clean your car. Make sure to empty the ashtray. Clean your home, including curtains and carpets. What can I do to help me quit smoking? Talk with your doctor about taking medicines and seeing a counselor at the same time. You are more likely to succeed when you do both. If you are pregnant or breastfeeding, talk with your doctor about counseling or other ways to quit smoking. Do not take medicine to help you quit smoking unless your doctor tells you to do so. To quit smoking: Quit right away Quit smoking totally, instead of slowly cutting back on how much you smoke over a period of time. Go to counseling. You are more likely to quit if you go to counseling sessions regularly. Take medicine You may take medicines to help you quit. Some medicines need a prescription, and some you can buy over-the-counter. Some medicines may contain a drug called nicotine to replace the nicotine in cigarettes. Medicines may: Help you to stop having the desire to smoke (cravings). Help to stop the  problems that come when you stop smoking (withdrawal symptoms). Your doctor may ask you to use: Nicotine patches, gum, or  lozenges. Nicotine inhalers or sprays. Non-nicotine medicine that is taken by mouth. Find resources Find resources and other ways to help you quit smoking and remain smoke-free after you quit. These resources are most helpful when you use them often. They include: Online chats with a Veterinary surgeon. Phone quitlines. Printed Materials engineer. Support groups or group counseling. Text messaging programs. Mobile phone apps. Use apps on your mobile phone or tablet that can help you stick to your quit plan. There are many free apps for mobile phones and tablets as well as websites. Examples include Quit Guide from the [employer] and smokefree.gov  What things can I do to make it easier to quit?  Talk to your family and friends. Ask them to support and encourage you. Call a phone quitline (1-800-QUIT-NOW), reach out to support groups, or work with a Veterinary surgeon. Ask people who smoke to not smoke around you. Avoid places that make you want to smoke, such as: Bars. Parties. Smoke-break areas at work. Spend time with people who do not smoke. Lower the stress in your life. Stress can make you want to smoke. Try these things to help your stress: Getting regular exercise. Doing deep-breathing exercises. Doing yoga. Meditating. Doing a body scan. To do this, close your eyes, focus on one area of your body at a time from head to toe. Notice which parts of your body are tense. Try to relax the muscles in those areas. How will I feel when I quit smoking? Day 1 to 3 weeks Within the first 24 hours, you may start to have some problems that come from quitting tobacco. These problems are very bad 2-3 days after you quit, but they do not often last for more than 2-3 weeks. You may get these symptoms: Mood swings. Feeling restless, nervous, angry, or annoyed. Trouble concentrating. Dizziness. Strong desire for high-sugar foods and nicotine. Weight gain. Trouble pooping (constipation). Feeling like you may vomit  (nausea). Coughing or a sore throat. Changes in how the medicines that you take for other issues work in your body. Depression. Trouble sleeping (insomnia). Week 3 and afterward After the first 2-3 weeks of quitting, you may start to notice more positive results, such as: Better sense of smell and taste. Less coughing and sore throat. Slower heart rate. Lower blood pressure. Clearer skin. Better breathing. Fewer sick days. Quitting smoking can be hard. Do not give up if you fail the first time. Some people need to try a few times before they succeed. Do your best to stick to your quit plan, and talk with yourdoctor if you have any questions or concerns. Summary Smoking tobacco is the leading cause of preventable death. Quitting smoking can be hard, but it is one of the best things that you can do for your health. When you decide to quit smoking, make a plan to help you succeed. Quit smoking right away, not slowly over a period of time. When you start quitting, seek help from your doctor, family, or friends. This information is not intended to replace advice given to you by your health care provider. Make sure you discuss any questions you have with your healthcare provider. Document Revised: 04/10/2019 Document Reviewed: 10/04/2018 Elsevier Patient Education  2022 [employer].

## 2021-01-09 NOTE — Progress Notes (Signed)
Subjective:   Darren Sherman is a 65 y.o. male who presents for Medicare Annual/Subsequent preventive examination.  Review of Systems    Review of Systems  Constitutional: Negative.   HENT: Negative.    Eyes: Negative.   Respiratory: Negative.    Cardiovascular: Negative.   Gastrointestinal: Negative.   Genitourinary: Negative.   Skin: Negative.   Neurological: Negative.   Endo/Heme/Allergies: Negative.   Psychiatric/Behavioral: Negative.     Cardiac Risk Factors include: advanced age (>42men, >37 women);hypertension;male gender     Objective:    Today's Vitals   01/09/21 1010  BP: (!) 151/90  Pulse: 65  Resp: 18  Temp: 97.8 F (36.6 C)  SpO2: 97%  Weight: 175 lb (79.4 kg)  Height: 5\' 6"  (1.676 m)   Body mass index is 28.25 kg/m.  Advanced Directives 12/02/2020 11/27/2020 10/25/2016 05/18/2016  Does Patient Have a Medical Advance Directive? No No No No  Would patient like information on creating a medical advance directive? No - Patient declined No - Patient declined - No - patient declined information    Current Medications (verified) Outpatient Encounter Medications as of 01/09/2021  Medication Sig   amLODipine (NORVASC) 10 MG tablet Take 1 tablet (10 mg total) by mouth daily.   furosemide (LASIX) 20 MG tablet Take 1 tablet (20 mg total) by mouth daily for 5 days.   loratadine (CLARITIN) 10 MG tablet Take 10 mg by mouth daily as needed for allergies. (Patient not taking: No sig reported)   meloxicam (MOBIC) 7.5 MG tablet Take 1 tablet (7.5 mg total) by mouth daily. (Patient not taking: No sig reported)   [DISCONTINUED] amLODipine (NORVASC) 10 MG tablet Take 1 tablet (10 mg total) by mouth daily. (Patient not taking: Reported on 01/02/2021)   No facility-administered encounter medications on file as of 01/09/2021.    Allergies (verified) Patient has no known allergies.   History: Past Medical History:  Diagnosis Date   Hypertension    Past Surgical History:   Procedure Laterality Date   APPENDECTOMY     COLONOSCOPY  06/03/2020   MASS EXCISION  09/2019   TOTAL HIP ARTHROPLASTY     Family History  Problem Relation Age of Onset   Diabetes Father    Colon cancer Neg Hx    Colon polyps Neg Hx    Esophageal cancer Neg Hx    Rectal cancer Neg Hx    Stomach cancer Neg Hx    Social History   Socioeconomic History   Marital status: Single    Spouse name: Not on file   Number of children: Not on file   Years of education: Not on file   Highest education level: Not on file  Occupational History   Not on file  Tobacco Use   Smoking status: Never   Smokeless tobacco: Never  Vaping Use   Vaping Use: Never used  Substance and Sexual Activity   Alcohol use: No   Drug use: No   Sexual activity: Not on file  Other Topics Concern   Not on file  Social History Narrative   Not on file   Social Determinants of Health   Financial Resource Strain: Medium Risk   Difficulty of Paying Living Expenses: Somewhat hard  Food Insecurity: No Food Insecurity   Worried About Running Out of Food in the Last Year: Never true   Ran Out of Food in the Last Year: Never true  Transportation Needs: No Transportation Needs   Lack of Transportation (Medical):  No   Lack of Transportation (Non-Medical): No  Physical Activity: Sufficiently Active   Days of Exercise per Week: 7 days   Minutes of Exercise per Session: 30 min  Stress: Stress Concern Present   Feeling of Stress : To some extent  Social Connections: Moderately Integrated   Frequency of Communication with Friends and Family: More than three times a week   Frequency of Social Gatherings with Friends and Family: Once a week   Attends Religious Services: More than 4 times per year   Active Member of [employer] or Organizations: Yes   Attends Engineer, structuralClub or Organization Meetings: More than 4 times per year   Marital Status: Divorced    Tobacco Counseling Counseling given: Yes   Clinical  Intake:  Pre-visit preparation completed: No  Pain : No/denies pain     BMI - recorded: 28.25 Nutritional Status: BMI 25 -29 Overweight  How often do you need to have someone help you when you read instructions, pamphlets, or other written materials from your doctor or pharmacy?: 1 - Never What is the last grade level you completed in school?: 12th  Diabetic? no         Activities of Daily Living In your present state of health, do you have any difficulty performing the following activities: 01/09/2021 11/23/2020  Hearing? N N  Vision? N N  Difficulty concentrating or making decisions? N N  Walking or climbing stairs? N Y  Dressing or bathing? N N  Doing errands, shopping? N N  Preparing Food and eating ? N -  Using the Toilet? N -  In the past six months, have you accidently leaked urine? N -  Do you have problems with loss of bowel control? N -  Managing your Medications? N -  Managing your Finances? N -  Housekeeping or managing your Housekeeping? N -  Some recent data might be hidden    Patient Care Team: Barbette MerinoKing, Crystal M, NP as PCP - General (Adult Health Nurse Practitioner)  Indicate any recent Medical Services you may have received from other than [facility] providers in the past year (date may be approximate).     Assessment:   This is a routine wellness examination for Darren Sherman.  Hearing/Vision screen No results found.  Dietary issues and exercise activities discussed: Current Exercise Habits: Home exercise routine, Type of exercise: walking, Time (Minutes): 30, Frequency (Times/Week): 7, Weekly Exercise (Minutes/Week): 210, Exercise limited by: orthopedic condition(s)   Goals Addressed             This Visit's Progress    DIET - EAT MORE FRUITS AND VEGETABLES         Depression Screen PHQ 2/9 Scores 01/09/2021 03/24/2020 12/30/2019 10/28/2019  PHQ - 2 Score 1 1 0 1    Fall Risk Fall Risk  01/09/2021 03/24/2020 12/30/2019 10/28/2019  Falls in the past year?  1 0 0 1  Number falls in past yr: 1 0 - 1  Injury with Fall? 0 0 - 1  Risk for fall due to : History of fall(s) Impaired balance/gait - -  Follow up Education provided - - -    FALL RISK PREVENTION PERTAINING TO THE HOME:  Any stairs in or around the home? No  If so, are there any without handrails? No  Home free of loose throw rugs in walkways, pet beds, electrical cords, etc? Yes  Adequate lighting in your home to reduce risk of falls? Yes   ASSISTIVE DEVICES UTILIZED TO PREVENT FALLS:  Life alert? No  Use of a cane, walker or w/c? No  Grab bars in the bathroom? No  Shower chair or bench in shower? No  Elevated toilet seat or a handicapped toilet? No   TIMED UP AND GO:  Was the test performed? Yes .  Length of time to ambulate 10 feet: 4 sec.   Gait steady and fast without use of assistive device  Cognitive Function: MMSE - Mini Mental State Exam 01/09/2021  Orientation to time 5  Orientation to Place 5  Registration 3  Attention/ Calculation 5  Recall 3  Language- name 2 objects 2  Language- repeat 1  Language- follow 3 step command 3  Language- read & follow direction 1  Write a sentence 1  Copy design 1  Total score 30        Immunizations Immunization History  Administered Date(s) Administered   PFIZER(Purple Top)SARS-COV-2 Vaccination 08/25/2019, 09/15/2019, 04/27/2020   Pneumococcal Conjugate-13 08/22/2020   Tdap 10/25/2016    TDAP status: Up to date  Flu Vaccine status: Up to date  Pneumococcal vaccine status: Up to date  Covid-19 vaccine status: Completed vaccines  Qualifies for Shingles Vaccine? Yes   Zostavax completed No   Shingrix Completed?: No.    Education has been provided regarding the importance of this vaccine. Patient has been advised to call insurance company to determine out of pocket expense if they have not yet received this vaccine. Advised may also receive vaccine at local pharmacy or Health Dept. Verbalized acceptance and  understanding.  Screening Tests Health Maintenance  Topic Date Due   Zoster Vaccines- Shingrix (1 of 2) Never done   COVID-19 Vaccine (4 - Booster for Pfizer series) 07/27/2020   HIV Screening  03/24/2021 (Originally 02/24/1971)   INFLUENZA VACCINE  02/27/2021   PNA vac Low Risk Adult (2 of 2 - PPSV23) 08/22/2021   TETANUS/TDAP  10/26/2026   COLONOSCOPY (Pts 45-61yrs Insurance coverage will need to be confirmed)  06/03/2030   Hepatitis C Screening  Completed   HPV VACCINES  Aged Out    Health Maintenance  Health Maintenance Due  Topic Date Due   Zoster Vaccines- Shingrix (1 of 2) Never done   COVID-19 Vaccine (4 - Booster for Pfizer series) 07/27/2020    Colorectal cancer screening: Type of screening: Colonoscopy. Completed 2022. Repeat every 10 years  Lung Cancer Screening: (Low Dose CT Chest recommended if Age 39-80 years, 30 pack-year currently smoking OR have quit w/in 15years.) does qualify.   Lung Cancer Screening Referral: referral placed  Additional Screening:  Hepatitis C Screening: does qualify; Completed   Vision Screening: Recommended annual ophthalmology exams for early detection of glaucoma and other disorders of the eye. Is the patient up to date with their annual eye exam?  No  Who is the provider or what is the name of the office in which the patient attends annual eye exams?  If pt is not established with a provider, would they like to be referred to a provider to establish care? Yes .   Dental Screening: Recommended annual dental exams for proper oral hygiene  Community Resource Referral / Chronic Care Management: CRR required this visit?  No   CCM required this visit?  No      Plan:     I have personally reviewed and noted the following in the patient's chart:   Medical and social history Use of alcohol, tobacco or illicit drugs  Current medications and supplements including opioid prescriptions. Patient is  not currently taking opioid  prescriptions. Functional ability and status Nutritional status Physical activity Advanced directives List of other physicians Hospitalizations, surgeries, and ER visits in previous 12 months Vitals Screenings to include cognitive, depression, and falls Referrals and appointments  In addition, I have reviewed and discussed with patient certain preventive protocols, quality metrics, and best practice recommendations. A written personalized care plan for preventive services as well as general preventive health recommendations were provided to patient.     Ivonne Andrew, NP   01/09/2021

## 2021-01-13 DIAGNOSIS — T8459XD Infection and inflammatory reaction due to other internal joint prosthesis, subsequent encounter: Secondary | ICD-10-CM | POA: Diagnosis not present

## 2021-01-13 DIAGNOSIS — T84038D Mechanical loosening of other internal prosthetic joint, subsequent encounter: Secondary | ICD-10-CM | POA: Diagnosis not present

## 2021-01-13 DIAGNOSIS — Z96641 Presence of right artificial hip joint: Secondary | ICD-10-CM | POA: Diagnosis not present

## 2021-01-13 DIAGNOSIS — I10 Essential (primary) hypertension: Secondary | ICD-10-CM | POA: Diagnosis not present

## 2021-01-13 DIAGNOSIS — M25551 Pain in right hip: Secondary | ICD-10-CM | POA: Diagnosis not present

## 2021-01-13 DIAGNOSIS — Z96649 Presence of unspecified artificial hip joint: Secondary | ICD-10-CM | POA: Diagnosis not present

## 2021-02-23 ENCOUNTER — Other Ambulatory Visit: Payer: Self-pay

## 2021-02-23 ENCOUNTER — Ambulatory Visit (INDEPENDENT_AMBULATORY_CARE_PROVIDER_SITE_OTHER): Payer: Medicare HMO | Admitting: Nurse Practitioner

## 2021-02-23 VITALS — BP 147/79 | HR 74 | Temp 97.7°F | Ht 66.0 in | Wt 175.1 lb

## 2021-02-23 DIAGNOSIS — I10 Essential (primary) hypertension: Secondary | ICD-10-CM

## 2021-02-23 LAB — POCT URINALYSIS DIPSTICK
Bilirubin, UA: NEGATIVE
Blood, UA: NEGATIVE
Glucose, UA: NEGATIVE
Ketones, UA: NEGATIVE
Leukocytes, UA: NEGATIVE
Nitrite, UA: NEGATIVE
Protein, UA: NEGATIVE
Spec Grav, UA: 1.015 (ref 1.010–1.025)
Urobilinogen, UA: 0.2 E.U./dL
pH, UA: 6.5 (ref 5.0–8.0)

## 2021-02-23 MED ORDER — AMLODIPINE BESYLATE 10 MG PO TABS
10.0000 mg | ORAL_TABLET | Freq: Every day | ORAL | 3 refills | Status: DC
Start: 2021-02-23 — End: 2021-10-23

## 2021-02-23 NOTE — Progress Notes (Signed)
[place], [place]  [address] Phone:  [phone]   Fax:  [phone]   Established Patient Office Visit  Subjective:  Patient ID: Darren Sherman, male    DOB: [birth year]  Age: 65 y.o. MRN: [id number]  CC:  Chief Complaint  Patient presents with   Follow-up    3 month follow up;     HPI Darren Sherman presents for follow up. He  has a past medical history of Hypertension.   Hypertension Patient is here for follow-up of elevated blood pressure. He is not exercising and is adherent to a low-salt diet. Blood pressure is well controlled at home. Cardiac symptoms: lower extremity edema. Patient denies chest pain, chest pressure/discomfort, claudication, dyspnea, exertional chest pressure/discomfort, fatigue, irregular heart beat, near-syncope, orthopnea, palpitations, paroxysmal nocturnal dyspnea, syncope, and tachypnea. Cardiovascular risk factors: hypertension and male gender. Use of agents associated with hypertension: none. History of target organ damage: none.  He reports that he did follow-up with orthopedist at [facility].  He underwent full evaluation and his hips were not affected negatively by the accident.  Past Medical History:  Diagnosis Date   Hypertension     Past Surgical History:  Procedure Laterality Date   APPENDECTOMY     COLONOSCOPY  06/03/2020   MASS EXCISION  09/2019   TOTAL HIP ARTHROPLASTY      Family History  Problem Relation Age of Onset   Diabetes Father    Colon cancer Neg Hx    Colon polyps Neg Hx    Esophageal cancer Neg Hx    Rectal cancer Neg Hx    Stomach cancer Neg Hx     Social History   Socioeconomic History   Marital status: Single    Spouse name: Not on file   Number of children: Not on file   Years of education: Not on file   Highest education level: Not on file  Occupational History   Not on file  Tobacco Use   Smoking status: Never   Smokeless tobacco: Never  Vaping Use   Vaping  Use: Never used  Substance and Sexual Activity   Alcohol use: No   Drug use: No   Sexual activity: Not on file  Other Topics Concern   Not on file  Social History Narrative   Not on file   Social Determinants of Health   Financial Resource Strain: Medium Risk   Difficulty of Paying Living Expenses: Somewhat hard  Food Insecurity: No Food Insecurity   Worried About Running Out of Food in the Last Year: Never true   Ran Out of Food in the Last Year: Never true  Transportation Needs: No Transportation Needs   Lack of Transportation (Medical): No   Lack of Transportation (Non-Medical): No  Physical Activity: Sufficiently Active   Days of Exercise per Week: 7 days   Minutes of Exercise per Session: 30 min  Stress: Stress Concern Present   Feeling of Stress : To some extent  Social Connections: Moderately Integrated   Frequency of Communication with Friends and Family: More than three times a week   Frequency of Social Gatherings with Friends and Family: Once a week   Attends Religious Services: More than 4 times per year   Active Member of [employer] or Organizations: Yes   Attends Music therapist: More than 4 times per year   Marital Status: Divorced  Human resources officer Violence: Not At Risk   Fear of Current or Ex-Partner:  No   Emotionally Abused: No   Physically Abused: No   Sexually Abused: No    Outpatient Medications Prior to Visit  Medication Sig Dispense Refill   amLODipine (NORVASC) 10 MG tablet Take 1 tablet (10 mg total) by mouth daily. 30 tablet 0   furosemide (LASIX) 20 MG tablet Take 1 tablet (20 mg total) by mouth daily for 5 days. 5 tablet 0   loratadine (CLARITIN) 10 MG tablet Take 10 mg by mouth daily as needed for allergies. (Patient not taking: No sig reported)     meloxicam (MOBIC) 7.5 MG tablet Take 1 tablet (7.5 mg total) by mouth daily. (Patient not taking: No sig reported) 10 tablet 0   No facility-administered medications prior to visit.     No Known Allergies  ROS Review of Systems    Objective:    Physical Exam HENT:     Head: Normocephalic and atraumatic.     Nose: Nose normal.     Mouth/Throat:     Mouth: Mucous membranes are moist.  Cardiovascular:     Rate and Rhythm: Normal rate and regular rhythm.     Pulses: Normal pulses.  Pulmonary:     Effort: Pulmonary effort is normal.  Abdominal:     Palpations: Abdomen is soft.  Musculoskeletal:        General: Normal range of motion.     Right lower leg: Edema (trace) present.     Left lower leg: Edema (trace) present.  Skin:    General: Skin is warm and dry.     Capillary Refill: Capillary refill takes less than 2 seconds.  Neurological:     General: No focal deficit present.     Mental Status: He is alert and oriented to person, place, and time.  Psychiatric:        Mood and Affect: Mood normal.        Behavior: Behavior normal.        Thought Content: Thought content normal.        Judgment: Judgment normal.    BP (!) 147/79 (BP Location: Right Arm, Patient Position: Sitting)   Pulse 74   Temp 97.7 F (36.5 C)   Ht 5' 6"  (1.676 m)   Wt 175 lb 0.8 oz (79.4 kg)   SpO2 99%   BMI 28.25 kg/m  Wt Readings from Last 3 Encounters:  02/23/21 175 lb 0.8 oz (79.4 kg)  01/09/21 175 lb (79.4 kg)  12/02/20 177 lb (80.3 kg)     Health Maintenance Due  Topic Date Due   Zoster Vaccines- Shingrix (1 of 2) Never done   COVID-19 Vaccine (4 - Booster for Pfizer series) 07/27/2020    There are no preventive care reminders to display for this patient.  Lab Results  Component Value Date   TSH 2.770 10/28/2019   Lab Results  Component Value Date   WBC 5.9 11/27/2020   HGB 12.2 (L) 11/27/2020   HCT 42.6 11/27/2020   MCV 80.8 11/27/2020   PLT 251 11/27/2020   Lab Results  Component Value Date   NA 139 11/27/2020   K 4.3 11/27/2020   CO2 28 11/27/2020   GLUCOSE 104 (H) 11/27/2020   BUN 16 11/27/2020   CREATININE 1.00 11/27/2020   BILITOT 0.5  11/27/2020   ALKPHOS 90 11/27/2020   AST 19 11/27/2020   ALT 13 11/27/2020   PROT 7.9 11/27/2020   ALBUMIN 3.9 11/27/2020   CALCIUM 9.3 11/27/2020   ANIONGAP 8 11/27/2020  EGFR 95 11/23/2020   Lab Results  Component Value Date   CHOL 199 11/02/2019   Lab Results  Component Value Date   HDL 62 11/02/2019   Lab Results  Component Value Date   LDLCALC 111 (H) 11/02/2019   Lab Results  Component Value Date   TRIG 151 (H) 11/02/2019   Lab Results  Component Value Date   CHOLHDL 3.2 11/02/2019   No results found for: HGBA1C    Assessment & Plan:   Problem List Items Addressed This Visit       Cardiovascular and Mediastinum   Essential hypertension - Primary Encouraged on going compliance with current medication regimen Encouraged home monitoring and recording BP <130/80 Eating a heart-healthy diet with less salt Encouraged regular physical activity      Relevant Orders   Urinalysis Dipstick    Meds ordered this encounter  Medications   amLODipine (NORVASC) 10 MG tablet    Sig: Take 1 tablet (10 mg total) by mouth daily.    Dispense:  90 tablet    Refill:  3     Follow-up: No follow-ups on file.    Vevelyn Francois, NP

## 2021-02-23 NOTE — Patient Instructions (Signed)
Managing Your Hypertension Hypertension, also called high blood pressure, is when the force of the blood pressing against the walls of the arteries is too strong. Arteries are blood vessels that carry blood from your heart throughout your body. Hypertension forces the heart to work harder to pump blood and may cause the arteries tobecome narrow or stiff. Understanding blood pressure readings Your personal target blood pressure may vary depending on your medical conditions, your age, and other factors. A blood pressure reading includes a higher number over a lower number. Ideally, your blood pressure should be below 120/80. You should know that: The first, or top, number is called the systolic pressure. It is a measure of the pressure in your arteries as your heart beats. The second, or bottom number, is called the diastolic pressure. It is a measure of the pressure in your arteries as the heart relaxes. Blood pressure is classified into four stages. Based on your blood pressure reading, your health care provider may use the following stages to determine what type of treatment you need, if any. Systolic pressure and diastolicpressure are measured in a unit called mmHg. Normal Systolic pressure: below 120. Diastolic pressure: below 80. Elevated Systolic pressure: 120-129. Diastolic pressure: below 80. Hypertension stage 1 Systolic pressure: 130-139. Diastolic pressure: 80-89. Hypertension stage 2 Systolic pressure: 140 or above. Diastolic pressure: 90 or above. How can this condition affect me? Managing your hypertension is an important responsibility. Over time, hypertension can damage the arteries and decrease blood flow to important parts of the body, including the brain, heart, and kidneys. Having untreated or uncontrolled hypertension can lead to: A heart attack. A stroke. A weakened blood vessel (aneurysm). Heart failure. Kidney damage. Eye damage. Metabolic syndrome. Memory and  concentration problems. Vascular dementia. What actions can I take to manage this condition? Hypertension can be managed by making lifestyle changes and possibly by taking medicines. Your health care provider will help you make a plan to bring yourblood pressure within a normal range. Nutrition  Eat a diet that is high in fiber and potassium, and low in salt (sodium), added sugar, and fat. An example eating plan is called the Dietary Approaches to Stop Hypertension (DASH) diet. To eat this way: Eat plenty of fresh fruits and vegetables. Try to fill one-half of your plate at each meal with fruits and vegetables. Eat whole grains, such as whole-wheat pasta, brown rice, or whole-grain bread. Fill about one-fourth of your plate with whole grains. Eat low-fat dairy products. Avoid fatty cuts of meat, processed or cured meats, and poultry with skin. Fill about one-fourth of your plate with lean proteins such as fish, chicken without skin, beans, eggs, and tofu. Avoid pre-made and processed foods. These tend to be higher in sodium, added sugar, and fat. Reduce your daily sodium intake. Most people with hypertension should eat less than 1,500 mg of sodium a day.  Lifestyle  Work with your health care provider to maintain a healthy body weight or to lose weight. Ask what an ideal weight is for you. Get at least 30 minutes of exercise that causes your heart to beat faster (aerobic exercise) most days of the week. Activities may include walking, swimming, or biking. Include exercise to strengthen your muscles (resistance exercise), such as weight lifting, as part of your weekly exercise routine. Try to do these types of exercises for 30 minutes at least 3 days a week. Do not use any products that contain nicotine or tobacco, such as cigarettes, e-cigarettes, and chewing   tobacco. If you need help quitting, ask your health care provider. Control any long-term (chronic) conditions you have, such as high  cholesterol or diabetes. Identify your sources of stress and find ways to manage stress. This may include meditation, deep breathing, or making time for fun activities.  Alcohol use Do not drink alcohol if: Your health care provider tells you not to drink. You are pregnant, may be pregnant, or are planning to become pregnant. If you drink alcohol: Limit how much you use to: 0-1 drink a day for women. 0-2 drinks a day for men. Be aware of how much alcohol is in your drink. In the [place] one drink equals one 12 oz bottle of beer (355 mL), one 5 oz glass of wine (148 mL), or one 1 oz glass of hard liquor (44 mL). Medicines Your health care provider may prescribe medicine if lifestyle changes are not enough to get your blood pressure under control and if: Your systolic blood pressure is 130 or higher. Your diastolic blood pressure is 80 or higher. Take medicines only as told by your health care provider. Follow the directions carefully. Blood pressure medicines must be taken as told by your health care provider. The medicine does not work as well when you skip doses. Skippingdoses also puts you at risk for problems. Monitoring Before you monitor your blood pressure: Do not smoke, drink caffeinated beverages, or exercise within 30 minutes before taking a measurement. Use the bathroom and empty your bladder (urinate). Sit quietly for at least 5 minutes before taking measurements. Monitor your blood pressure at home as told by your health care provider. To do this: Sit with your back straight and supported. Place your feet flat on the floor. Do not cross your legs. Support your arm on a flat surface, such as a table. Make sure your upper arm is at heart level. Each time you measure, take two or three readings one minute apart and record the results. You may also need to have your blood pressure checked regularly by your healthcare provider. General information Talk with your health care  provider about your diet, exercise habits, and other lifestyle factors that may be contributing to hypertension. Review all the medicines you take with your health care provider because there may be side effects or interactions. Keep all visits as told by your health care provider. Your health care provider can help you create and adjust your plan for managing your high blood pressure. Where to find more information National Heart, Lung, and Blood Institute: www.nhlbi.nih.gov American Heart Association: www.heart.org Contact a health care provider if: You think you are having a reaction to medicines you have taken. You have repeated (recurrent) headaches. You feel dizzy. You have swelling in your ankles. You have trouble with your vision. Get help right away if: You develop a severe headache or confusion. You have unusual weakness or numbness, or you feel faint. You have severe pain in your chest or abdomen. You vomit repeatedly. You have trouble breathing. These symptoms may represent a serious problem that is an emergency. Do not wait to see if the symptoms will go away. Get medical help right away. Call your local emergency services (911 in the [place] Do not drive yourself to the hospital. Summary Hypertension is when the force of blood pumping through your arteries is too strong. If this condition is not controlled, it may put you at risk for serious complications. Your personal target blood pressure may vary depending on your medical conditions,   your age, and other factors. For most people, a normal blood pressure is less than 120/80. Hypertension is managed by lifestyle changes, medicines, or both. Lifestyle changes to help manage hypertension include losing weight, eating a healthy, low-sodium diet, exercising more, stopping smoking, and limiting alcohol. This information is not intended to replace advice given to you by your health care provider. Make sure you discuss any questions  you have with your healthcare provider. Document Revised: 08/21/2019 Document Reviewed: 06/16/2019 Elsevier Patient Education  2022 Elsevier Inc.  

## 2021-02-24 ENCOUNTER — Encounter: Payer: Self-pay | Admitting: Nurse Practitioner

## 2021-04-28 DIAGNOSIS — R69 Illness, unspecified: Secondary | ICD-10-CM | POA: Diagnosis not present

## 2021-05-26 ENCOUNTER — Other Ambulatory Visit: Payer: Self-pay

## 2021-05-26 ENCOUNTER — Encounter: Payer: Self-pay | Admitting: Nurse Practitioner

## 2021-05-26 ENCOUNTER — Ambulatory Visit (INDEPENDENT_AMBULATORY_CARE_PROVIDER_SITE_OTHER): Payer: Medicare HMO | Admitting: Nurse Practitioner

## 2021-05-26 VITALS — BP 137/68 | HR 69 | Temp 97.9°F | Ht 66.0 in | Wt 179.2 lb

## 2021-05-26 DIAGNOSIS — Z125 Encounter for screening for malignant neoplasm of prostate: Secondary | ICD-10-CM

## 2021-05-26 DIAGNOSIS — I10 Essential (primary) hypertension: Secondary | ICD-10-CM

## 2021-05-26 DIAGNOSIS — Z471 Aftercare following joint replacement surgery: Secondary | ICD-10-CM | POA: Diagnosis not present

## 2021-05-26 DIAGNOSIS — M25551 Pain in right hip: Secondary | ICD-10-CM | POA: Diagnosis not present

## 2021-05-26 DIAGNOSIS — E781 Pure hyperglyceridemia: Secondary | ICD-10-CM | POA: Diagnosis not present

## 2021-05-26 DIAGNOSIS — T8459XD Infection and inflammatory reaction due to other internal joint prosthesis, subsequent encounter: Secondary | ICD-10-CM | POA: Diagnosis not present

## 2021-05-26 DIAGNOSIS — Z96649 Presence of unspecified artificial hip joint: Secondary | ICD-10-CM | POA: Diagnosis not present

## 2021-05-26 DIAGNOSIS — Z96641 Presence of right artificial hip joint: Secondary | ICD-10-CM | POA: Diagnosis not present

## 2021-05-26 NOTE — Progress Notes (Signed)
[place], [place]  [address] Phone:  [phone]   Fax:  [phone]   Established Patient Office Visit  Subjective:  Patient ID: Darren Sherman, male    DOB: [birth year]  Age: 65 y.o. MRN: [id number]  CC:  Chief Complaint  Patient presents with   Follow-up    Pt is here today for his follow up visit.    HPI Doil Kamara presents for follow up. He  has a past medical history of Hypertension.   He reports that he is compliant with his medication. He has made some lifestyle changes and has reduced his intake of unhealthy snacks. He does tend to eat one meal per day. He does not like eating 3 meals. He works fulltime but does sit a lot but feels like his is also active. Denies headache, dizziness, visual changes, shortness of breath, dyspnea on exertion, chest pain, nausea, vomiting or any edema. He is up at night voiding x2 but this is due to late night fluid intake.   Past Medical History:  Diagnosis Date   Hypertension     Past Surgical History:  Procedure Laterality Date   APPENDECTOMY     COLONOSCOPY  06/03/2020   MASS EXCISION  09/2019   TOTAL HIP ARTHROPLASTY      Family History  Problem Relation Age of Onset   Diabetes Father    Colon cancer Neg Hx    Colon polyps Neg Hx    Esophageal cancer Neg Hx    Rectal cancer Neg Hx    Stomach cancer Neg Hx     Social History   Socioeconomic History   Marital status: Single    Spouse name: Not on file   Number of children: Not on file   Years of education: Not on file   Highest education level: Not on file  Occupational History   Not on file  Tobacco Use   Smoking status: Never   Smokeless tobacco: Never  Vaping Use   Vaping Use: Never used  Substance and Sexual Activity   Alcohol use: No   Drug use: No   Sexual activity: Not Currently  Other Topics Concern   Not on file  Social History Narrative   Not on file   Social Determinants of Health   Financial  Resource Strain: Medium Risk   Difficulty of Paying Living Expenses: Somewhat hard  Food Insecurity: No Food Insecurity   Worried About Running Out of Food in the Last Year: Never true   Ran Out of Food in the Last Year: Never true  Transportation Needs: No Transportation Needs   Lack of Transportation (Medical): No   Lack of Transportation (Non-Medical): No  Physical Activity: Sufficiently Active   Days of Exercise per Week: 7 days   Minutes of Exercise per Session: 30 min  Stress: Stress Concern Present   Feeling of Stress : To some extent  Social Connections: Moderately Integrated   Frequency of Communication with Friends and Family: More than three times a week   Frequency of Social Gatherings with Friends and Family: Once a week   Attends Religious Services: More than 4 times per year   Active Member of [employer] or Organizations: Yes   Attends Music therapist: More than 4 times per year   Marital Status: Divorced  Human resources officer Violence: Not At Risk   Fear of Current or Ex-Partner: No   Emotionally Abused: No   Physically Abused: No  Sexually Abused: No    Outpatient Medications Prior to Visit  Medication Sig Dispense Refill   amLODipine (NORVASC) 10 MG tablet Take 1 tablet (10 mg total) by mouth daily. 90 tablet 3   No facility-administered medications prior to visit.    No Known Allergies  ROS Review of Systems    Objective:    Physical Exam Constitutional:      Appearance: He is obese.  HENT:     Head: Normocephalic and atraumatic.     Nose: Nose normal.     Mouth/Throat:     Mouth: Mucous membranes are moist.  Cardiovascular:     Rate and Rhythm: Normal rate. Rhythm irregular.     Pulses: Normal pulses.     Heart sounds: Normal heart sounds.     Comments: Regular irregularity  Pulmonary:     Effort: Pulmonary effort is normal.     Breath sounds: Normal breath sounds.  Abdominal:     Tenderness: There is no abdominal tenderness.      Hernia: No hernia is present.     Comments: Increased abdominal girth   Musculoskeletal:        General: Normal range of motion.     Cervical back: Normal range of motion.  Skin:    General: Skin is warm and dry.     Capillary Refill: Capillary refill takes less than 2 seconds.  Neurological:     General: No focal deficit present.     Mental Status: He is alert and oriented to person, place, and time.  Psychiatric:        Mood and Affect: Mood normal.        Behavior: Behavior normal.        Thought Content: Thought content normal.        Judgment: Judgment normal.    BP 137/68   Pulse 69   Temp 97.9 F (36.6 C)   Ht 5' 6"  (1.676 m)   Wt 179 lb 3.2 oz (81.3 kg)   SpO2 98%   BMI 28.92 kg/m  Wt Readings from Last 3 Encounters:  05/26/21 179 lb 3.2 oz (81.3 kg)  02/23/21 175 lb 0.8 oz (79.4 kg)  01/09/21 175 lb (79.4 kg)     Health Maintenance Due  Topic Date Due   Pneumonia Vaccine 35+ Years old (2 - PPSV23 if available, else PCV20) 08/22/2021    There are no preventive care reminders to display for this patient.  Lab Results  Component Value Date   TSH 2.770 10/28/2019   Lab Results  Component Value Date   WBC 5.9 11/27/2020   HGB 12.2 (L) 11/27/2020   HCT 42.6 11/27/2020   MCV 80.8 11/27/2020   PLT 251 11/27/2020   Lab Results  Component Value Date   NA 139 11/27/2020   K 4.3 11/27/2020   CO2 28 11/27/2020   GLUCOSE 104 (H) 11/27/2020   BUN 16 11/27/2020   CREATININE 1.00 11/27/2020   BILITOT 0.5 11/27/2020   ALKPHOS 90 11/27/2020   AST 19 11/27/2020   ALT 13 11/27/2020   PROT 7.9 11/27/2020   ALBUMIN 3.9 11/27/2020   CALCIUM 9.3 11/27/2020   ANIONGAP 8 11/27/2020   EGFR 95 11/23/2020   Lab Results  Component Value Date   CHOL 199 11/02/2019   Lab Results  Component Value Date   HDL 62 11/02/2019   Lab Results  Component Value Date   LDLCALC 111 (H) 11/02/2019   Lab Results  Component Value Date  TRIG 151 (H) 11/02/2019   Lab  Results  Component Value Date   CHOLHDL 3.2 11/02/2019   No results found for: HGBA1C    Assessment & Plan:   Problem List Items Addressed This Visit       Cardiovascular and Mediastinum   Essential hypertension - Primary Encouraged on going compliance with current medication regimen Encouraged home monitoring and recording BP <130/80 Eating a heart-healthy diet with less salt Encouraged regular physical activity  Recommend Weight loss   Relevant Orders   Comp. Metabolic Panel (12)   Other Visit Diagnoses     High triglycerides   Reevaluation  Continue to encourage heart healthy diet     Relevant Orders   Lipid panel   Screening for prostate cancer   Nocturia discussed      Relevant Orders   PSA       No orders of the defined types were placed in this encounter.   Follow-up: Return in about 3 months (around 08/26/2021) for Follow up HTN [id number].    Vevelyn Francois, NP

## 2021-05-26 NOTE — Patient Instructions (Signed)
Managing Your Hypertension Hypertension, also called high blood pressure, is when the force of the blood pressing against the walls of the arteries is too strong. Arteries are blood vessels that carry blood from your heart throughout your body. Hypertension forces the heart to work harder to pump blood and may cause the arteries tobecome narrow or stiff. Understanding blood pressure readings Your personal target blood pressure may vary depending on your medical conditions, your age, and other factors. A blood pressure reading includes a higher number over a lower number. Ideally, your blood pressure should be below 120/80. You should know that: The first, or top, number is called the systolic pressure. It is a measure of the pressure in your arteries as your heart beats. The second, or bottom number, is called the diastolic pressure. It is a measure of the pressure in your arteries as the heart relaxes. Blood pressure is classified into four stages. Based on your blood pressure reading, your health care provider may use the following stages to determine what type of treatment you need, if any. Systolic pressure and diastolicpressure are measured in a unit called mmHg. Normal Systolic pressure: below 120. Diastolic pressure: below 80. Elevated Systolic pressure: 120-129. Diastolic pressure: below 80. Hypertension stage 1 Systolic pressure: 130-139. Diastolic pressure: 80-89. Hypertension stage 2 Systolic pressure: 140 or above. Diastolic pressure: 90 or above. How can this condition affect me? Managing your hypertension is an important responsibility. Over time, hypertension can damage the arteries and decrease blood flow to important parts of the body, including the brain, heart, and kidneys. Having untreated or uncontrolled hypertension can lead to: A heart attack. A stroke. A weakened blood vessel (aneurysm). Heart failure. Kidney damage. Eye damage. Metabolic syndrome. Memory and  concentration problems. Vascular dementia. What actions can I take to manage this condition? Hypertension can be managed by making lifestyle changes and possibly by taking medicines. Your health care provider will help you make a plan to bring yourblood pressure within a normal range. Nutrition  Eat a diet that is high in fiber and potassium, and low in salt (sodium), added sugar, and fat. An example eating plan is called the Dietary Approaches to Stop Hypertension (DASH) diet. To eat this way: Eat plenty of fresh fruits and vegetables. Try to fill one-half of your plate at each meal with fruits and vegetables. Eat whole grains, such as whole-wheat pasta, brown rice, or whole-grain bread. Fill about one-fourth of your plate with whole grains. Eat low-fat dairy products. Avoid fatty cuts of meat, processed or cured meats, and poultry with skin. Fill about one-fourth of your plate with lean proteins such as fish, chicken without skin, beans, eggs, and tofu. Avoid pre-made and processed foods. These tend to be higher in sodium, added sugar, and fat. Reduce your daily sodium intake. Most people with hypertension should eat less than 1,500 mg of sodium a day.  Lifestyle  Work with your health care provider to maintain a healthy body weight or to lose weight. Ask what an ideal weight is for you. Get at least 30 minutes of exercise that causes your heart to beat faster (aerobic exercise) most days of the week. Activities may include walking, swimming, or biking. Include exercise to strengthen your muscles (resistance exercise), such as weight lifting, as part of your weekly exercise routine. Try to do these types of exercises for 30 minutes at least 3 days a week. Do not use any products that contain nicotine or tobacco, such as cigarettes, e-cigarettes, and chewing   tobacco. If you need help quitting, ask your health care provider. Control any long-term (chronic) conditions you have, such as high  cholesterol or diabetes. Identify your sources of stress and find ways to manage stress. This may include meditation, deep breathing, or making time for fun activities.  Alcohol use Do not drink alcohol if: Your health care provider tells you not to drink. You are pregnant, may be pregnant, or are planning to become pregnant. If you drink alcohol: Limit how much you use to: 0-1 drink a day for women. 0-2 drinks a day for men. Be aware of how much alcohol is in your drink. In the [place] one drink equals one 12 oz bottle of beer (355 mL), one 5 oz glass of wine (148 mL), or one 1 oz glass of hard liquor (44 mL). Medicines Your health care provider may prescribe medicine if lifestyle changes are not enough to get your blood pressure under control and if: Your systolic blood pressure is 130 or higher. Your diastolic blood pressure is 80 or higher. Take medicines only as told by your health care provider. Follow the directions carefully. Blood pressure medicines must be taken as told by your health care provider. The medicine does not work as well when you skip doses. Skippingdoses also puts you at risk for problems. Monitoring Before you monitor your blood pressure: Do not smoke, drink caffeinated beverages, or exercise within 30 minutes before taking a measurement. Use the bathroom and empty your bladder (urinate). Sit quietly for at least 5 minutes before taking measurements. Monitor your blood pressure at home as told by your health care provider. To do this: Sit with your back straight and supported. Place your feet flat on the floor. Do not cross your legs. Support your arm on a flat surface, such as a table. Make sure your upper arm is at heart level. Each time you measure, take two or three readings one minute apart and record the results. You may also need to have your blood pressure checked regularly by your healthcare provider. General information Talk with your health care  provider about your diet, exercise habits, and other lifestyle factors that may be contributing to hypertension. Review all the medicines you take with your health care provider because there may be side effects or interactions. Keep all visits as told by your health care provider. Your health care provider can help you create and adjust your plan for managing your high blood pressure. Where to find more information National Heart, Lung, and Blood Institute: www.nhlbi.nih.gov American Heart Association: www.heart.org Contact a health care provider if: You think you are having a reaction to medicines you have taken. You have repeated (recurrent) headaches. You feel dizzy. You have swelling in your ankles. You have trouble with your vision. Get help right away if: You develop a severe headache or confusion. You have unusual weakness or numbness, or you feel faint. You have severe pain in your chest or abdomen. You vomit repeatedly. You have trouble breathing. These symptoms may represent a serious problem that is an emergency. Do not wait to see if the symptoms will go away. Get medical help right away. Call your local emergency services (911 in the [place] Do not drive yourself to the hospital. Summary Hypertension is when the force of blood pumping through your arteries is too strong. If this condition is not controlled, it may put you at risk for serious complications. Your personal target blood pressure may vary depending on your medical conditions,   your age, and other factors. For most people, a normal blood pressure is less than 120/80. Hypertension is managed by lifestyle changes, medicines, or both. Lifestyle changes to help manage hypertension include losing weight, eating a healthy, low-sodium diet, exercising more, stopping smoking, and limiting alcohol. This information is not intended to replace advice given to you by your health care provider. Make sure you discuss any questions  you have with your healthcare provider. Document Revised: 08/21/2019 Document Reviewed: 06/16/2019 Elsevier Patient Education  2022 Elsevier Inc.  

## 2021-06-01 LAB — LIPID PANEL
Chol/HDL Ratio: 3.7 ratio (ref 0.0–5.0)
Cholesterol, Total: 187 mg/dL (ref 100–199)
HDL: 51 mg/dL (ref >39)
LDL Chol Calc (NIH): 112 mg/dL — ABNORMAL HIGH (ref 0–99)
Triglycerides: 135 mg/dL (ref 0–149)
VLDL Cholesterol Cal: 24 mg/dL (ref 5–40)

## 2021-06-01 LAB — COMP. METABOLIC PANEL (12)
AST: 14 IU/L (ref 0–40)
Albumin/Globulin Ratio: 1.5 (ref 1.2–2.2)
Albumin: 4.1 g/dL (ref 3.8–4.8)
Alkaline Phosphatase: 105 IU/L (ref 44–121)
BUN/Creatinine Ratio: 13 (ref 10–24)
BUN: 12 mg/dL (ref 8–27)
Bilirubin Total: 0.3 mg/dL (ref 0.0–1.2)
Calcium: 9.1 mg/dL (ref 8.6–10.2)
Chloride: 102 mmol/L (ref 96–106)
Creatinine, Ser: 0.94 mg/dL (ref 0.76–1.27)
Globulin, Total: 2.8 g/dL (ref 1.5–4.5)
Glucose: 93 mg/dL (ref 70–99)
Potassium: 4.2 mmol/L (ref 3.5–5.2)
Sodium: 142 mmol/L (ref 134–144)
Total Protein: 6.9 g/dL (ref 6.0–8.5)
eGFR: 89 mL/min/{1.73_m2} (ref >59)

## 2021-06-01 LAB — PSA: Prostate Specific Ag, Serum: 1.7 ng/mL (ref 0.0–4.0)

## 2021-07-27 ENCOUNTER — Other Ambulatory Visit: Payer: Self-pay | Admitting: *Deleted

## 2021-07-27 DIAGNOSIS — F17210 Nicotine dependence, cigarettes, uncomplicated: Secondary | ICD-10-CM

## 2021-07-27 DIAGNOSIS — Z87891 Personal history of nicotine dependence: Secondary | ICD-10-CM

## 2021-08-15 ENCOUNTER — Emergency Department (HOSPITAL_COMMUNITY)
Admission: EM | Admit: 2021-08-15 | Discharge: 2021-08-15 | Disposition: A | Discharge status: Home / Self Care | Payer: Medicare PPO | Attending: Student | Admitting: Student

## 2021-08-15 ENCOUNTER — Emergency Department (HOSPITAL_COMMUNITY): Payer: Medicare PPO

## 2021-08-15 DIAGNOSIS — B9789 Other viral agents as the cause of diseases classified elsewhere: Secondary | ICD-10-CM | POA: Diagnosis not present

## 2021-08-15 DIAGNOSIS — Z79899 Other long term (current) drug therapy: Secondary | ICD-10-CM | POA: Diagnosis not present

## 2021-08-15 DIAGNOSIS — I517 Cardiomegaly: Secondary | ICD-10-CM | POA: Diagnosis not present

## 2021-08-15 DIAGNOSIS — J069 Acute upper respiratory infection, unspecified: Secondary | ICD-10-CM | POA: Insufficient documentation

## 2021-08-15 DIAGNOSIS — R059 Cough, unspecified: Secondary | ICD-10-CM | POA: Diagnosis not present

## 2021-08-15 DIAGNOSIS — Z20822 Contact with and (suspected) exposure to covid-19: Secondary | ICD-10-CM | POA: Diagnosis not present

## 2021-08-15 DIAGNOSIS — I10 Essential (primary) hypertension: Secondary | ICD-10-CM | POA: Insufficient documentation

## 2021-08-15 DIAGNOSIS — R051 Acute cough: Secondary | ICD-10-CM

## 2021-08-15 DIAGNOSIS — R0789 Other chest pain: Secondary | ICD-10-CM | POA: Diagnosis not present

## 2021-08-15 DIAGNOSIS — R079 Chest pain, unspecified: Secondary | ICD-10-CM | POA: Diagnosis not present

## 2021-08-15 DIAGNOSIS — R0602 Shortness of breath: Secondary | ICD-10-CM | POA: Diagnosis not present

## 2021-08-15 LAB — BASIC METABOLIC PANEL
Anion gap: 6 (ref 5–15)
BUN: 8 mg/dL (ref 8–23)
CO2: 25 mmol/L (ref 22–32)
Calcium: 8.4 mg/dL — ABNORMAL LOW (ref 8.9–10.3)
Chloride: 105 mmol/L (ref 98–111)
Creatinine, Ser: 0.78 mg/dL (ref 0.61–1.24)
GFR, Estimated: >60 mL/min (ref >60)
Glucose, Bld: 89 mg/dL (ref 70–99)
Potassium: 3.9 mmol/L (ref 3.5–5.1)
Sodium: 136 mmol/L (ref 135–145)

## 2021-08-15 LAB — CBC
HCT: 41.6 % (ref 39.0–52.0)
Hemoglobin: 12.8 g/dL — ABNORMAL LOW (ref 13.0–17.0)
MCH: 25.9 pg — ABNORMAL LOW (ref 26.0–34.0)
MCHC: 30.8 g/dL (ref 30.0–36.0)
MCV: 84 fL (ref 80.0–100.0)
Platelets: 212 10*3/uL (ref 150–400)
RBC: 4.95 MIL/uL (ref 4.22–5.81)
RDW: 13.4 % (ref 11.5–15.5)
WBC: 7.5 10*3/uL (ref 4.0–10.5)
nRBC: 0 % (ref 0.0–0.2)

## 2021-08-15 LAB — RESP PANEL BY RT-PCR (FLU A&B, COVID) ARPGX2
Influenza A by PCR: NEGATIVE
Influenza B by PCR: NEGATIVE
SARS Coronavirus 2 by RT PCR: NEGATIVE

## 2021-08-15 LAB — TROPONIN I (HIGH SENSITIVITY)
Troponin I (High Sensitivity): 8 ng/L (ref <18)
Troponin I (High Sensitivity): 10 ng/L (ref <18)

## 2021-08-15 MED ORDER — PREDNISONE 20 MG PO TABS
60.0000 mg | ORAL_TABLET | Freq: Once | ORAL | Status: AC
  Administered 2021-08-15: 60 mg via ORAL
  Filled 2021-08-15: qty 3

## 2021-08-15 MED ORDER — IPRATROPIUM-ALBUTEROL 0.5-2.5 (3) MG/3ML IN SOLN
3.0000 mL | Freq: Once | RESPIRATORY_TRACT | Status: AC
  Administered 2021-08-15: 3 mL via RESPIRATORY_TRACT
  Filled 2021-08-15: qty 3

## 2021-08-15 MED ORDER — METHYLPREDNISOLONE 4 MG PO TBPK: ORAL_TABLET | ORAL | 0 refills | Status: DC

## 2021-08-15 MED ORDER — ALBUTEROL SULFATE HFA 108 (90 BASE) MCG/ACT IN AERS: 1.0000 | INHALATION_SPRAY | Freq: Four times a day (QID) | RESPIRATORY_TRACT | 0 refills | Status: AC | PRN

## 2021-08-15 NOTE — ED Provider Notes (Signed)
[facility] EMERGENCY DEPARTMENT Provider Note   CSN: [id number] Arrival date & time: 08/15/21  4098     History  Chief Complaint  Patient presents with   Chest Pain   Cough   Shortness of Breath    Darren Sherman is a 66 y.o. male.  Patient with history of hypertension presents today with chief complaint of cough. He states that same began 1 week ago. States that in the morning 1 week ago he got his COVID booster, in the evening he developed a cough with congestion and associated chest tightness. He states that he has tried several over-the-counter cold medications for management without success.  He states that he has had many coughing fits where he is unable to stop coughing for an extended amount of time. Over the past couple of days he then developed chest pain which is only present when he coughs and is in the lower part of his bilateral chest and does not radiate.  Additionally, he states that he is somewhat short of breath but only after coughing fits. His symptoms are not exertional in nature. No associated leg pain or swelling, no recent surgeries, no history of malignancy or blood clots, no recent travel. The pain does not radiate and is burning and sharp in nature.  He denies any history of smoking or asthma.  The history is provided by the patient. No language interpreter was used.  Chest Pain Associated symptoms: cough and shortness of breath   Associated symptoms: no dysphagia, no fever, no headache, no nausea, no palpitations and no vomiting   Cough Associated symptoms: chest pain, shortness of breath and wheezing   Associated symptoms: no chills, no fever, no headaches, no rash, no rhinorrhea and no sore throat   Shortness of Breath Associated symptoms: chest pain, cough and wheezing   Associated symptoms: no fever, no headaches, no neck pain, no rash, no sore throat and no vomiting       Home Medications Prior to Admission medications    Medication Sig Start Date End Date Taking? Authorizing Provider  amLODipine (NORVASC) 10 MG tablet Take 1 tablet (10 mg total) by mouth daily. 02/23/21 02/23/22  Barbette Merino, NP      Allergies    Patient has no known allergies.    Review of Systems   Review of Systems  Constitutional:  Negative for chills and fever.  HENT:  Positive for congestion. Negative for drooling, mouth sores, postnasal drip, rhinorrhea, sinus pressure, sinus pain, sore throat, trouble swallowing and voice change.   Respiratory:  Positive for cough, shortness of breath and wheezing. Negative for apnea, choking and stridor.   Cardiovascular:  Positive for chest pain. Negative for palpitations and leg swelling.  Gastrointestinal:  Negative for anal bleeding, diarrhea, nausea and vomiting.  Musculoskeletal:  Negative for neck pain and neck stiffness.  Skin:  Negative for rash.  Neurological:  Negative for headaches.  All other systems reviewed and are negative.  Physical Exam Updated Vital Signs BP (!) 142/83    Pulse 69    Temp 98.6 F (37 C) (Oral)    Resp 17    SpO2 97%  Physical Exam Vitals and nursing note reviewed.  Constitutional:      General: He is not in acute distress.    Appearance: He is well-developed and normal weight. He is not ill-appearing, toxic-appearing or diaphoretic.     Comments: Patient resting comfortably in bed in no acute distress  HENT:  Head: Normocephalic and atraumatic.  Eyes:     Extraocular Movements: Extraocular movements intact.     Pupils: Pupils are equal, round, and reactive to light.  Cardiovascular:     Rate and Rhythm: Normal rate and regular rhythm.     Heart sounds: Normal heart sounds.  Pulmonary:     Effort: Pulmonary effort is normal.     Breath sounds: Wheezing present.  Abdominal:     Palpations: Abdomen is soft.  Musculoskeletal:        General: Normal range of motion.     Cervical back: Normal range of motion and neck supple.     Right lower  leg: No tenderness. No edema.     Left lower leg: No tenderness. No edema.  Skin:    General: Skin is warm and dry.  Neurological:     General: No focal deficit present.     Mental Status: He is alert.    ED Results / Procedures / Treatments   Labs (all labs ordered are listed, but only abnormal results are displayed) Labs Reviewed  BASIC METABOLIC PANEL - Abnormal; Notable for the following components:      Result Value   Calcium 8.4 (*)    All other components within normal limits  CBC - Abnormal; Notable for the following components:   Hemoglobin 12.8 (*)    MCH 25.9 (*)    All other components within normal limits  RESP PANEL BY RT-PCR (FLU A&B, COVID) [id number]  TROPONIN I (HIGH SENSITIVITY)  TROPONIN I (HIGH SENSITIVITY)    EKG None  Radiology DG Chest 2 View  Result Date: 08/15/2021 CLINICAL DATA:  Cough, shortness of breath, and chest pain. Also recent COVID vaccination one week ago. EXAM: CHEST - 2 VIEW COMPARISON:  Overlapping portions of CT abdomen from 11/27/2020 FINDINGS: Stable mild cardiomegaly. Mild thoracic spondylosis. No blunting of the costophrenic angles. The lungs appear clear. IMPRESSION: 1. Stable mild cardiomegaly.  The lungs appear clear. Electronically Signed   By: Van Clines M.D.   On: 08/15/2021 09:15    Procedures Procedures    Medications Ordered in ED Medications  ipratropium-albuterol (DUONEB) 0.5-2.5 (3) MG/3ML nebulizer solution 3 mL (has no administration in time range)  predniSONE (DELTASONE) tablet 60 mg (has no administration in time range)    ED Course/ Medical Decision Making/ A&P                           Medical Decision Making Amount and/or Complexity of Data Reviewed Labs: ordered. Radiology: ordered.  Risk Prescription drug management.   This patient presents to the ED for concern of cough.   Lab Tests:  I Ordered, and personally interpreted labs.  The pertinent results include:  No leukocytosis, anemia  consistent with baseline, troponin negative, COVID and flu negative   Imaging Studies ordered:  I ordered imaging studies including CXR  I independently visualized and interpreted imaging which showed stable mild cardiomegaly. The lungs appear clear I agree with the radiologist interpretation   Cardiac Monitoring:  The patient was maintained on a cardiac monitor.  I personally viewed and interpreted the cardiac monitored which showed an underlying rhythm of: No STEMI, sinus rhythm with sinus arrythmia   Medicines ordered and prescription drug management:  I ordered medication including prednisone and duoneb x 2  for wheezing  Reevaluation of the patient after these medicines showed that the patient improved I have reviewed the patients home medicines and  have made adjustments as needed   Test Considered:  Considered D-dimer/CTPE for evaluation of potential PE, however clinical picture much more consistent with URI with cough. No PE risk factors, reassuring vital signs with chest pain only with cough and came on gradually, pain is not pleuritic. No shortness of breath. No leg pain. No tachycardia. Suspect pain is due to chest irritation from coughing. Significant improvement with albuterol and prednisone. PE significantly less likely at this time.     Reevaluation:  After the interventions noted above, I reevaluated the patient and found that they have :improved   Dispostion:  After consideration of the diagnostic results and the patients response to treatment, I feel that the patent would benefit from outpatient management of symptoms.  Patient presents today with 1 week of dry cough.  Wheezing appreciated on exam.  Chest x-ray unremarkable for signs of acute abnormality or pneumonia.  Patient denies any shortness of breath, and states that his chest pain is only present with coughing.  Significant improvement after 2 DuoNeb's and oral prednisone.  He is afebrile,  nontoxic-appearing, and in no acute distress with reassuring vital signs.  He is able to ambulate without shortness of breath or difficulty.  Work-up unremarkable for acute abnormalities today, low suspicion for ACS, PE, endocarditis, myocarditis, pericarditis, pneumothorax, or other emergent concerns.  Stable for discharge at this time.  Will prescribe steroids and albuterol to go home with for wheezing.  He is understanding and amenable with plans of discharge.  Educated on red flag symptoms of prompt immediate return.  Discharged in stable condition.  Findings and plan of care discussed with supervising physician Dr. Matilde Sprang who is in agreement.     Final Clinical Impression(s) / ED Diagnoses Final diagnoses:  Acute cough  Viral upper respiratory tract infection    Rx / DC Orders ED Discharge Orders          Ordered    methylPREDNISolone (MEDROL DOSEPAK) 4 MG TBPK tablet        08/15/21 1518    albuterol (VENTOLIN HFA) 108 (90 Base) MCG/ACT inhaler  Every 6 hours PRN        08/15/21 1518          An After Visit Summary was printed and given to the patient.     Nestor Lewandowsky 08/15/21 1530    Teressa Lower, MD 08/15/21 1622

## 2021-08-15 NOTE — ED Triage Notes (Signed)
Pt. Stated, I took a COVID shot and I started having a cough, SOB and chest pain , it started after the shot

## 2021-08-15 NOTE — Discharge Instructions (Signed)
Your work-up in the ER today was reassuring for acute abnormalities.  Suspect that your symptoms are related to an upper respiratory infection.  As these are almost always viral in nature, supportive care is indicated.  Given that you were wheezing on my initial exam significant improvement with albuterol inhaler and steroids, I have prescribed you these to continue taking for your symptoms.  Please take these as prescribed.  Follow-up with your primary care in next few days for continued evaluation and management.  Return if development of any new or worsening symptoms.

## 2021-08-15 NOTE — ED Notes (Signed)
Discharge instructions reviewed with patient. Patient verbalized understanding of instructions. Follow-up care and medications were reviewed. Patient ambulatory with steady gait. VSS upon discharge.  ?

## 2021-08-21 ENCOUNTER — Encounter: Payer: Self-pay | Admitting: Acute Care

## 2021-08-21 ENCOUNTER — Other Ambulatory Visit: Payer: Self-pay

## 2021-08-21 ENCOUNTER — Ambulatory Visit (INDEPENDENT_AMBULATORY_CARE_PROVIDER_SITE_OTHER): Payer: Medicare PPO | Admitting: Acute Care

## 2021-08-21 DIAGNOSIS — Z87891 Personal history of nicotine dependence: Secondary | ICD-10-CM

## 2021-08-21 NOTE — Patient Instructions (Signed)
Thank you for participating in the [facility]. °It was our pleasure to meet you today. °We will call you with the results of your scan within the next few days. °Your scan will be assigned a Lung RADS category score by the physicians reading the scans.  °This Lung RADS score determines follow up scanning.  °See below for description of categories, and follow up screening recommendations. °We will be in touch to schedule your follow up screening annually or based on recommendations of our providers. °We will fax a copy of your scan results to your Primary Care Physician, or the physician who referred you to the program, to ensure they have the results. °Please call the office if you have any questions or concerns regarding your scanning experience or results.  °Our office number is [phone]. °Please speak with Denise Phelps, RN. She is our Lung Cancer Screening RN. °If she is unavailable when you call, please have the office staff send her a message. She will return your call at her earliest convenience. °Remember, if your scan is normal, we will scan you annually as long as you continue to meet the criteria for the program. (Age 55-77, Current smoker or smoker who has quit within the last 15 years). °If you are a smoker, remember, quitting is the single most powerful action that you can take to decrease your risk of lung cancer and other pulmonary, breathing related problems. °We know quitting is hard, and we are here to help.  °Please let us know if there is anything we can do to help you meet your goal of quitting. °If you are a former smoker, congratulations. We are proud of you! Remain smoke free! °Remember you can refer friends or family members through the number above.  °We will screen them to make sure they meet criteria for the program. °Thank you for helping us take better care of you by participating in Lung Screening. ° °You can receive free nicotine replacement therapy  ( patches, gum or mints) by calling 1-800-QUIT NOW. Please call so we can get you on the path to becoming  a non-smoker. I know it is hard, but you can do this! ° °Lung RADS Categories: ° °Lung RADS 1: no nodules or definitely non-concerning nodules.  °Recommendation is for a repeat annual scan in 12 months. ° °Lung RADS 2:  nodules that are non-concerning in appearance and behavior with a very low likelihood of becoming an active cancer. °Recommendation is for a repeat annual scan in 12 months. ° °Lung RADS 3: nodules that are probably non-concerning , includes nodules with a low likelihood of becoming an active cancer.  Recommendation is for a 6-month repeat screening scan. Often noted after an upper respiratory illness. We will be in touch to make sure you have no questions, and to schedule your 6-month scan. ° °Lung RADS 4 A: nodules with concerning findings, recommendation is most often for a follow up scan in 3 months or additional testing based on our provider's assessment of the scan. We will be in touch to make sure you have no questions and to schedule the recommended 3 month follow up scan. ° °Lung RADS 4 B:  indicates findings that are concerning. We will be in touch with you to schedule additional diagnostic testing based on our provider's  assessment of the scan. ° °Hypnosis for smoking cessation  °[employer] °[phone] ° °Acupuncture for smoking cessation  °[facility] °[phone]  °

## 2021-08-21 NOTE — Progress Notes (Signed)
Virtual Visit via Telephone Note  I connected with Darren Sherman on 06/13/21 at  2:00 PM EST by telephone and verified that I am speaking with the correct person using two identifiers.  Location: Patient: Home Provider: Working from Home    I discussed the limitations, risks, security and privacy concerns of performing an evaluation and management service by telephone and the availability of in person appointments. I also discussed with the patient that there may be a patient responsible charge related to this service. The patient expressed understanding and agreed to proceed.  Shared Decision Making Visit Lung Cancer Screening Program [phone])   Eligibility: Age 66 y.o. Pack Years Smoking History Calculation 20 (# packs/per year x # years smoked) Recent History of coughing up blood  no Unexplained weight loss? no ( >Than 15 pounds within the last 6 months ) Prior History Lung / other cancer no (Diagnosis within the last 5 years already requiring surveillance chest CT Scans). Smoking Status Former Smoker Former Smokers: Years since quit: 11 years  Quit Date: 2012  Visit Components: Discussion included one or more decision making aids. yes Discussion included risk/benefits of screening. yes Discussion included potential follow up diagnostic testing for abnormal scans. yes Discussion included meaning and risk of over diagnosis. yes Discussion included meaning and risk of False Positives. yes Discussion included meaning of total radiation exposure. yes  Counseling Included: Importance of adherence to annual lung cancer LDCT screening. yes Impact of comorbidities on ability to participate in the program. yes Ability and willingness to under diagnostic treatment. yes  Smoking Cessation Counseling: Current Smokers:  Discussed importance of smoking cessation. yes Information about tobacco cessation classes and interventions provided to patient. yes Patient provided with "ticket"  for LDCT Scan. yes Symptomatic Patient. no  Counseling NA Diagnosis Code: Tobacco Use Z72.0 Asymptomatic Patient yes  Counseling NA Former Smokers:  Discussed the importance of maintaining cigarette abstinence. yes Diagnosis Code: Personal History of Nicotine Dependence. C58.527 Information about tobacco cessation classes and interventions provided to patient. Yes Patient provided with "ticket" for LDCT Scan. yes Written Order for Lung Cancer Screening with LDCT placed in Epic. Yes (CT Chest Lung Cancer Screening Low Dose W/O CM) [id number] Z12.2-Screening of respiratory organs Z87.891-Personal history of nicotine dependence   I spent 25 minutes of face to face time with him discussing the risks and benefits of lung cancer screening. We viewed a power point together that explained in detail the above noted topics. We took the time to pause the power point at intervals to allow for questions to be asked and answered to ensure understanding. We discussed that he had taken the single most powerful action possible to decrease his risk of developing lung cancer when he quit smoking. I counseled him to remain smoke free, and to contact me if he ever had the desire to smoke again so that I can provide resources and tools to help support the effort to remain smoke free. We discussed the time and location of the scan, and that either  Abigail Miyamoto RN or I will call with the results within  24-48 hours of receiving them. He has my card and contact information in the event he needs to speak with me, in addition to a copy of the power point we reviewed as a resource. He verbalized understanding of all of the above and had no further questions upon leaving the office.     I explained to the patient that there has been a high incidence of  coronary artery disease noted on these exams. I explained that this is a non-gated exam therefore degree or severity cannot be determined. This patient is not on statin  therapy. I have asked the patient to follow-up with their PCP regarding any incidental finding of coronary artery disease and management with diet or medication as they feel is clinically indicated. The patient verbalized understanding of the above and had no further questions.  Gentri Guardado D. Tiburcio Pea, NP-C [facility] Personal contact information can be found on Amion  08/21/2021, 10:25 AM

## 2021-08-22 ENCOUNTER — Ambulatory Visit (INDEPENDENT_AMBULATORY_CARE_PROVIDER_SITE_OTHER)
Admission: RE | Admit: 2021-08-22 | Discharge: 2021-08-22 | Disposition: A | Discharge status: Home / Self Care | Payer: Medicare PPO | Source: Ambulatory Visit | Attending: Nurse Practitioner | Admitting: Nurse Practitioner

## 2021-08-22 DIAGNOSIS — Z87891 Personal history of nicotine dependence: Secondary | ICD-10-CM | POA: Diagnosis not present

## 2021-08-22 DIAGNOSIS — F17210 Nicotine dependence, cigarettes, uncomplicated: Secondary | ICD-10-CM

## 2021-08-22 DIAGNOSIS — J439 Emphysema, unspecified: Secondary | ICD-10-CM | POA: Diagnosis not present

## 2021-08-24 ENCOUNTER — Other Ambulatory Visit: Payer: Self-pay | Admitting: Acute Care

## 2021-08-24 DIAGNOSIS — Z87891 Personal history of nicotine dependence: Secondary | ICD-10-CM

## 2021-08-28 ENCOUNTER — Other Ambulatory Visit: Payer: Self-pay

## 2021-08-28 ENCOUNTER — Encounter: Payer: Self-pay | Admitting: Nurse Practitioner

## 2021-08-28 ENCOUNTER — Ambulatory Visit (INDEPENDENT_AMBULATORY_CARE_PROVIDER_SITE_OTHER): Payer: Medicare PPO | Admitting: Nurse Practitioner

## 2021-08-28 VITALS — BP 149/77 | HR 85 | Temp 98.4°F | Ht 66.0 in | Wt 181.2 lb

## 2021-08-28 DIAGNOSIS — I10 Essential (primary) hypertension: Secondary | ICD-10-CM

## 2021-08-28 DIAGNOSIS — R052 Subacute cough: Secondary | ICD-10-CM

## 2021-08-28 DIAGNOSIS — E781 Pure hyperglyceridemia: Secondary | ICD-10-CM

## 2021-08-28 MED ORDER — BENZONATATE 100 MG PO CAPS: 100.0000 mg | ORAL_CAPSULE | Freq: Three times a day (TID) | ORAL | 0 refills | Status: AC | PRN

## 2021-08-28 NOTE — Progress Notes (Signed)
[name], [place]  [address] Phone:  [phone]   Fax:  [phone]   Established Patient Office Visit  Subjective:  Patient ID: Darren Sherman, male    DOB: 11-Apr-1956  Age: 66 y.o. MRN: [id number]  CC:  Chief Complaint  Patient presents with   Follow-up    Pt is here today for his 3 month follow up visit with no concerns or issues.    HPI Darren Sherman presents for follow up. He  has a past medical history of Hypertension.   He is in today for HTN follow up. He is prescribed Amlodipine 10 mg daily. He is compliant with his medication. He is discouraged about his weight. He does have some limitations due to his resent URI. He felt like is was more severe due to receiving the COVID vaccine. He eats one solid meal per day.   Past Medical History:  Diagnosis Date   Hypertension     Past Surgical History:  Procedure Laterality Date   APPENDECTOMY     COLONOSCOPY  06/03/2020   MASS EXCISION  09/2019   TOTAL HIP ARTHROPLASTY      Family History  Problem Relation Age of Onset   Diabetes Father    Colon cancer Neg Hx    Colon polyps Neg Hx    Esophageal cancer Neg Hx    Rectal cancer Neg Hx    Stomach cancer Neg Hx     Social History   Socioeconomic History   Marital status: Single    Spouse name: Not on file   Number of children: Not on file   Years of education: Not on file   Highest education level: Not on file  Occupational History   Not on file  Tobacco Use   Smoking status: Former    Packs/day: 0.60    Years: 35.00    Pack years: 21.00    Types: Cigarettes    Quit date: 2012    Years since quitting: 11.0   Smokeless tobacco: Never  Vaping Use   Vaping Use: Never used  Substance and Sexual Activity   Alcohol use: No   Drug use: No   Sexual activity: Not Currently  Other Topics Concern   Not on file  Social History Narrative   Not on file   Social Determinants of Health   Financial Resource  Strain: Medium Risk   Difficulty of Paying Living Expenses: Somewhat hard  Food Insecurity: No Food Insecurity   Worried About Charity fundraiser in the Last Year: Never true   Ran Out of Food in the Last Year: Never true  Transportation Needs: No Transportation Needs   Lack of Transportation (Medical): No   Lack of Transportation (Non-Medical): No  Physical Activity: Sufficiently Active   Days of Exercise per Week: 7 days   Minutes of Exercise per Session: 30 min  Stress: Stress Concern Present   Feeling of Stress : To some extent  Social Connections: Moderately Integrated   Frequency of Communication with Friends and Family: More than three times a week   Frequency of Social Gatherings with Friends and Family: Once a week   Attends Religious Services: More than 4 times per year   Active Member of [employer] or Organizations: Yes   Attends Music therapist: More than 4 times per year   Marital Status: Divorced  Human resources officer Violence: Not At Risk   Fear of Current or Ex-Partner: No  Emotionally Abused: No   Physically Abused: No   Sexually Abused: No    Outpatient Medications Prior to Visit  Medication Sig Dispense Refill   albuterol (VENTOLIN HFA) 108 (90 Base) MCG/ACT inhaler Inhale 1-2 puffs into the lungs every 6 (six) hours as needed for wheezing or shortness of breath. 1 each 0   amLODipine (NORVASC) 10 MG tablet Take 1 tablet (10 mg total) by mouth daily. 90 tablet 3   DM-Phenylephrine-Acetaminophen (THERAFLU WARMING RELIEF COLD PO) Take 1 packet by mouth 2 (two) times daily as needed (congestion). (Patient not taking: Reported on 08/28/2021)     methylPREDNISolone (MEDROL DOSEPAK) 4 MG TBPK tablet Take as directed on package (Patient not taking: Reported on 08/28/2021) 1 each 0   Phenyleph-Doxylamine-DM-APAP (NYQUIL SEVERE COLD/FLU) 5-6.25-10-325 MG/15ML LIQD Take 15 mLs by mouth 2 (two) times daily as needed (congestion/slep). (Patient not taking: Reported on  08/28/2021)     No facility-administered medications prior to visit.    No Known Allergies  ROS Review of Systems  Respiratory:  Positive for cough (occasional).      Objective:    Physical Exam HENT:     Head: Normocephalic and atraumatic.     Nose: Nose normal.     Mouth/Throat:     Mouth: Mucous membranes are moist.  Cardiovascular:     Rate and Rhythm: Normal rate.     Pulses: Normal pulses.     Heart sounds: Normal heart sounds.     Comments: Regular rhythm per pt  Pulmonary:     Effort: Pulmonary effort is normal.     Breath sounds: Normal breath sounds.  Abdominal:     Comments: Increased abdominal girth hypoactive  Musculoskeletal:        General: Normal range of motion.     Cervical back: Normal range of motion.  Skin:    General: Skin is warm and dry.     Capillary Refill: Capillary refill takes less than 2 seconds.  Neurological:     General: No focal deficit present.     Mental Status: He is alert and oriented to person, place, and time.  Psychiatric:        Mood and Affect: Mood normal.        Behavior: Behavior normal.        Thought Content: Thought content normal.        Judgment: Judgment normal.    BP (!) 149/77    Pulse 85    Temp 98.4 F (36.9 C)    Ht 5' 6"  (1.676 m)    Wt 181 lb 3.2 oz (82.2 kg)    SpO2 98%    BMI 29.25 kg/m  Wt Readings from Last 3 Encounters:  08/28/21 181 lb 3.2 oz (82.2 kg)  05/26/21 179 lb 3.2 oz (81.3 kg)  02/23/21 175 lb 0.8 oz (79.4 kg)     Health Maintenance Due  Topic Date Due   Zoster Vaccines- Shingrix (1 of 2) Never done   COVID-19 Vaccine (4 - Booster for Pfizer series) 06/22/2020   Pneumonia Vaccine 62+ Years old (2 - PPSV23 if available, else PCV20) 08/22/2021    There are no preventive care reminders to display for this patient.  Lab Results  Component Value Date   TSH 2.770 10/28/2019   Lab Results  Component Value Date   WBC 7.5 08/15/2021   HGB 12.8 (L) 08/15/2021   HCT 41.6 08/15/2021    MCV 84.0 08/15/2021   PLT 212 08/15/2021  Lab Results  Component Value Date   NA 136 08/15/2021   K 3.9 08/15/2021   CO2 25 08/15/2021   GLUCOSE 89 08/15/2021   BUN 8 08/15/2021   CREATININE 0.78 08/15/2021   BILITOT 0.3 05/26/2021   ALKPHOS 105 05/26/2021   AST 14 05/26/2021   ALT 13 11/27/2020   PROT 6.9 05/26/2021   ALBUMIN 4.1 05/26/2021   CALCIUM 8.4 (L) 08/15/2021   ANIONGAP 6 08/15/2021   EGFR 89 05/26/2021   Lab Results  Component Value Date   CHOL 187 05/26/2021   Lab Results  Component Value Date   HDL 51 05/26/2021   Lab Results  Component Value Date   LDLCALC 112 (H) 05/26/2021   Lab Results  Component Value Date   TRIG 135 05/26/2021   Lab Results  Component Value Date   CHOLHDL 3.7 05/26/2021   No results found for: HGBA1C    Assessment & Plan:   Problem List Items Addressed This Visit       Cardiovascular and Mediastinum   Essential hypertension - Primary Stable  Encouraged on going compliance with current medication regimen Encouraged home monitoring and recording BP <130/80 Eating a heart-healthy diet with less salt Encouraged regular physical activity  Recommend Weight loss     Other Visit Diagnoses     High triglycerides     Encourage Omega 3   Subacute cough           No orders of the defined types were placed in this encounter.   Follow-up: No follow-ups on file.    Vevelyn Francois, NP

## 2021-08-28 NOTE — Patient Instructions (Signed)

## 2021-09-01 DIAGNOSIS — Z471 Aftercare following joint replacement surgery: Secondary | ICD-10-CM | POA: Diagnosis not present

## 2021-09-01 DIAGNOSIS — Z96649 Presence of unspecified artificial hip joint: Secondary | ICD-10-CM | POA: Diagnosis not present

## 2021-09-01 DIAGNOSIS — Z96641 Presence of right artificial hip joint: Secondary | ICD-10-CM | POA: Diagnosis not present

## 2021-09-01 DIAGNOSIS — I10 Essential (primary) hypertension: Secondary | ICD-10-CM | POA: Diagnosis not present

## 2021-09-01 DIAGNOSIS — T8459XD Infection and inflammatory reaction due to other internal joint prosthesis, subsequent encounter: Secondary | ICD-10-CM | POA: Diagnosis not present

## 2021-09-01 DIAGNOSIS — Z87891 Personal history of nicotine dependence: Secondary | ICD-10-CM | POA: Diagnosis not present

## 2021-10-23 ENCOUNTER — Ambulatory Visit (INDEPENDENT_AMBULATORY_CARE_PROVIDER_SITE_OTHER): Payer: Medicare PPO | Admitting: Nurse Practitioner

## 2021-10-23 ENCOUNTER — Other Ambulatory Visit: Payer: Self-pay

## 2021-10-23 ENCOUNTER — Encounter: Payer: Self-pay | Admitting: Nurse Practitioner

## 2021-10-23 DIAGNOSIS — I10 Essential (primary) hypertension: Secondary | ICD-10-CM | POA: Diagnosis not present

## 2021-10-23 MED ORDER — AMLODIPINE BESYLATE 10 MG PO TABS: 10.0000 mg | ORAL_TABLET | Freq: Every day | ORAL | 0 refills | Status: DC

## 2021-10-23 NOTE — Progress Notes (Signed)
? ?[facility] [place] ?[place], [place]  [address] ?Phone:  [phone]   Fax:  [phone] ? ? ?Established Patient Office Visit ? ?Subjective:  ?Patient ID: Darren Sherman, male    DOB: 07-02-1956  Age: 66 y.o. MRN: [id number] ? ?CC:  ?Chief Complaint  ?Patient presents with  ? Follow-up  ?  Patient is here today for his 3 month follow up visit with no concerns or issues to discuss  ? ? ?HPI ?Amahd Morino presents for follow up. He  has a past medical history of Hypertension.  ? ?Mr. Spruce is in today for follow up for Hypertension. The current prescribed treatment is amlodipine 10 mg Compliance is reported and home blood pressure monitoring is done with a range. The  DASH diet is somewhat being followed. An exercise regimen is not ongoing; active. There is a goal to maintain BP. Denies headache, dizziness, visual changes, shortness of breath, dyspnea on exertion, chest pain, nausea, vomiting or any edema.  ? ? ?Past Medical History:  ?Diagnosis Date  ? Hypertension   ? ? ?Past Surgical History:  ?Procedure Laterality Date  ? APPENDECTOMY    ? COLONOSCOPY  06/03/2020  ? MASS EXCISION  09/2019  ? TOTAL HIP ARTHROPLASTY    ? ? ?Family History  ?Problem Relation Age of Onset  ? Diabetes Father   ? Colon cancer Neg Hx   ? Colon polyps Neg Hx   ? Esophageal cancer Neg Hx   ? Rectal cancer Neg Hx   ? Stomach cancer Neg Hx   ? ? ?Social History  ? ?Socioeconomic History  ? Marital status: Single  ?  Spouse name: Not on file  ? Number of children: Not on file  ? Years of education: Not on file  ? Highest education level: Not on file  ?Occupational History  ? Not on file  ?Tobacco Use  ? Smoking status: Former  ?  Packs/day: 0.60  ?  Years: 35.00  ?  Pack years: 21.00  ?  Types: Cigarettes  ?  Quit date: 2012  ?  Years since quitting: 11.2  ? Smokeless tobacco: Never  ?Vaping Use  ? Vaping Use: Never used  ?Substance and Sexual Activity  ? Alcohol use: No  ? Drug use: No  ? Sexual activity: Not  Currently  ?Other Topics Concern  ? Not on file  ?Social History Narrative  ? Not on file  ? ?Social Determinants of Health  ? ?Financial Resource Strain: Medium Risk  ? Difficulty of Paying Living Expenses: Somewhat hard  ?Food Insecurity: No Food Insecurity  ? Worried About Charity fundraiser in the Last Year: Never true  ? Ran Out of Food in the Last Year: Never true  ?Transportation Needs: No Transportation Needs  ? Lack of Transportation (Medical): No  ? Lack of Transportation (Non-Medical): No  ?Physical Activity: Sufficiently Active  ? Days of Exercise per Week: 7 days  ? Minutes of Exercise per Session: 30 min  ?Stress: Stress Concern Present  ? Feeling of Stress : To some extent  ?Social Connections: Moderately Integrated  ? Frequency of Communication with Friends and Family: More than three times a week  ? Frequency of Social Gatherings with Friends and Family: Once a week  ? Attends Religious Services: More than 4 times per year  ? Active Member of Clubs or Organizations: Yes  ? Attends Archivist Meetings: More than 4 times per year  ? Marital  Status: Divorced  ?Intimate Partner Violence: Not At Risk  ? Fear of Current or Ex-Partner: No  ? Emotionally Abused: No  ? Physically Abused: No  ? Sexually Abused: No  ? ? ?Outpatient Medications Prior to Visit  ?Medication Sig Dispense Refill  ? amLODipine (NORVASC) 10 MG tablet Take 1 tablet (10 mg total) by mouth daily. 90 tablet 3  ? albuterol (VENTOLIN HFA) 108 (90 Base) MCG/ACT inhaler Inhale 1-2 puffs into the lungs every 6 (six) hours as needed for wheezing or shortness of breath. (Patient not taking: Reported on 10/23/2021) 1 each 0  ? ?No facility-administered medications prior to visit.  ? ? ?No Known Allergies ? ?ROS ?Review of Systems ? ?  ?Objective:  ?  ?Physical Exam ?HENT:  ?   Head: Normocephalic.  ?   Nose: Nose normal.  ?   Mouth/Throat:  ?   Mouth: Mucous membranes are moist.  ?Cardiovascular:  ?   Rate and Rhythm: Normal rate.  ?    Pulses: Normal pulses.  ?   Heart sounds: Normal heart sounds.  ?Pulmonary:  ?   Effort: Pulmonary effort is normal.  ?   Breath sounds: Normal breath sounds.  ?Abdominal:  ?   Palpations: Abdomen is soft.  ?Musculoskeletal:     ?   General: Normal range of motion.  ?   Cervical back: Normal range of motion.  ?   Right lower leg: No edema.  ?   Left lower leg: No edema.  ?Skin: ?   General: Skin is warm.  ?   Capillary Refill: Capillary refill takes less than 2 seconds.  ?Neurological:  ?   General: No focal deficit present.  ?   Mental Status: He is alert and oriented to person, place, and time.  ? ? ?BP 140/84   Pulse 67   Temp 98.6 ?F (37 ?C)   Ht 5' 6"  (1.676 m)   Wt 179 lb 6.4 oz (81.4 kg)   SpO2 100%   BMI 28.96 kg/m?  ?Wt Readings from Last 3 Encounters:  ?10/23/21 179 lb 6.4 oz (81.4 kg)  ?08/28/21 181 lb 3.2 oz (82.2 kg)  ?05/26/21 179 lb 3.2 oz (81.3 kg)  ? ? ? ?Health Maintenance Due  ?Topic Date Due  ? COVID-19 Vaccine (5 - Booster for Pfizer series) 10/10/2021  ? ? ?There are no preventive care reminders to display for this patient. ? ?Lab Results  ?Component Value Date  ? TSH 2.770 10/28/2019  ? ?Lab Results  ?Component Value Date  ? WBC 7.5 08/15/2021  ? HGB 12.8 (L) 08/15/2021  ? HCT 41.6 08/15/2021  ? MCV 84.0 08/15/2021  ? PLT 212 08/15/2021  ? ?Lab Results  ?Component Value Date  ? NA 136 08/15/2021  ? K 3.9 08/15/2021  ? CO2 25 08/15/2021  ? GLUCOSE 89 08/15/2021  ? BUN 8 08/15/2021  ? CREATININE 0.78 08/15/2021  ? BILITOT 0.3 05/26/2021  ? ALKPHOS 105 05/26/2021  ? AST 14 05/26/2021  ? ALT 13 11/27/2020  ? PROT 6.9 05/26/2021  ? ALBUMIN 4.1 05/26/2021  ? CALCIUM 8.4 (L) 08/15/2021  ? ANIONGAP 6 08/15/2021  ? EGFR 89 05/26/2021  ? ?Lab Results  ?Component Value Date  ? CHOL 187 05/26/2021  ? ?Lab Results  ?Component Value Date  ? HDL 51 05/26/2021  ? ?Lab Results  ?Component Value Date  ? LDLCALC 112 (H) 05/26/2021  ? ?Lab Results  ?Component Value Date  ? TRIG 135 05/26/2021  ? ?Lab  Results  ?Component Value Date  ? CHOLHDL 3.7 05/26/2021  ? ?No results found for: HGBA1C ? ?  ?Assessment & Plan:  ? ?Problem List Items Addressed This Visit   ? ?  ? Cardiovascular and Mediastinum  ? Essential hypertension ?Encouraged on going compliance with current medication regimen ?Encouraged home monitoring and recording BP <130/80 ?Eating a heart-healthy diet with less salt ?Encouraged regular physical activity  ?Recommend Weight loss ? ?  ? Relevant Medications  ? amLODipine (NORVASC) 10 MG tablet  ? ? ?Meds ordered this encounter  ?Medications  ? amLODipine (NORVASC) 10 MG tablet  ?  Sig: Take 1 tablet (10 mg total) by mouth daily.  ?  Dispense:  90 tablet  ?  Refill:  0  ?  Order Specific Question:   Supervising Provider  ?  AnswerTresa Garter [[id number]]  ? ? ?Follow-up: Return in about 3 months (around 01/23/2022) for Follow up HTN [id number].  ? ? ?Vevelyn Francois, NP ? ?

## 2021-10-23 NOTE — Patient Instructions (Signed)
Managing Your Hypertension Hypertension, also called high blood pressure, is when the force of the blood pressing against the walls of the arteries is too strong. Arteries are blood vessels that carry blood from your heart throughout your body. Hypertension forces the heart to work harder to pump blood and may cause the arteries to become narrow or stiff. Understanding blood pressure readings Your personal target blood pressure may vary depending on your medical conditions, your age, and other factors. A blood pressure reading includes a higher number over a lower number. Ideally, your blood pressure should be below 120/80. You should know that: The first, or top, number is called the systolic pressure. It is a measure of the pressure in your arteries as your heart beats. The second, or bottom number, is called the diastolic pressure. It is a measure of the pressure in your arteries as the heart relaxes. Blood pressure is classified into four stages. Based on your blood pressure reading, your health care provider may use the following stages to determine what type of treatment you need, if any. Systolic pressure and diastolic pressure are measured in a unit called mmHg. Normal Systolic pressure: below 120. Diastolic pressure: below 80. Elevated Systolic pressure: 120-129. Diastolic pressure: below 80. Hypertension stage 1 Systolic pressure: 130-139. Diastolic pressure: 80-89. Hypertension stage 2 Systolic pressure: 140 or above. Diastolic pressure: 90 or above. How can this condition affect me? Managing your hypertension is an important responsibility. Over time, hypertension can damage the arteries and decrease blood flow to important parts of the body, including the brain, heart, and kidneys. Having untreated or uncontrolled hypertension can lead to: A heart attack. A stroke. A weakened blood vessel (aneurysm). Heart failure. Kidney damage. Eye damage. Metabolic syndrome. Memory and  concentration problems. Vascular dementia. What actions can I take to manage this condition? Hypertension can be managed by making lifestyle changes and possibly by taking medicines. Your health care provider will help you make a plan to bring your blood pressure within a normal range. Nutrition  Eat a diet that is high in fiber and potassium, and low in salt (sodium), added sugar, and fat. An example eating plan is called the Dietary Approaches to Stop Hypertension (DASH) diet. To eat this way: Eat plenty of fresh fruits and vegetables. Try to fill one-half of your plate at each meal with fruits and vegetables. Eat whole grains, such as whole-wheat pasta, brown rice, or whole-grain bread. Fill about one-fourth of your plate with whole grains. Eat low-fat dairy products. Avoid fatty cuts of meat, processed or cured meats, and poultry with skin. Fill about one-fourth of your plate with lean proteins such as fish, chicken without skin, beans, eggs, and tofu. Avoid pre-made and processed foods. These tend to be higher in sodium, added sugar, and fat. Reduce your daily sodium intake. Most people with hypertension should eat less than 1,500 mg of sodium a day. Lifestyle  Work with your health care provider to maintain a healthy body weight or to lose weight. Ask what an ideal weight is for you. Get at least 30 minutes of exercise that causes your heart to beat faster (aerobic exercise) most days of the week. Activities may include walking, swimming, or biking. Include exercise to strengthen your muscles (resistance exercise), such as weight lifting, as part of your weekly exercise routine. Try to do these types of exercises for 30 minutes at least 3 days a week. Do not use any products that contain nicotine or tobacco, such as cigarettes, e-cigarettes,   and chewing tobacco. If you need help quitting, ask your health care provider. Control any long-term (chronic) conditions you have, such as high  cholesterol or diabetes. Identify your sources of stress and find ways to manage stress. This may include meditation, deep breathing, or making time for fun activities. Alcohol use Do not drink alcohol if: Your health care provider tells you not to drink. You are pregnant, may be pregnant, or are planning to become pregnant. If you drink alcohol: Limit how much you use to: 0-1 drink a day for women. 0-2 drinks a day for men. Be aware of how much alcohol is in your drink. In the [place] one drink equals one 12 oz bottle of beer (355 mL), one 5 oz glass of wine (148 mL), or one 1 oz glass of hard liquor (44 mL). Medicines Your health care provider may prescribe medicine if lifestyle changes are not enough to get your blood pressure under control and if: Your systolic blood pressure is 130 or higher. Your diastolic blood pressure is 80 or higher. Take medicines only as told by your health care provider. Follow the directions carefully. Blood pressure medicines must be taken as told by your health care provider. The medicine does not work as well when you skip doses. Skipping doses also puts you at risk for problems. Monitoring Before you monitor your blood pressure: Do not smoke, drink caffeinated beverages, or exercise within 30 minutes before taking a measurement. Use the bathroom and empty your bladder (urinate). Sit quietly for at least 5 minutes before taking measurements. Monitor your blood pressure at home as told by your health care provider. To do this: Sit with your back straight and supported. Place your feet flat on the floor. Do not cross your legs. Support your arm on a flat surface, such as a table. Make sure your upper arm is at heart level. Each time you measure, take two or three readings one minute apart and record the results. You may also need to have your blood pressure checked regularly by your health care provider. General information Talk with your health care  provider about your diet, exercise habits, and other lifestyle factors that may be contributing to hypertension. Review all the medicines you take with your health care provider because there may be side effects or interactions. Keep all visits as told by your health care provider. Your health care provider can help you create and adjust your plan for managing your high blood pressure. Where to find more information National Heart, Lung, and Blood Institute: www.nhlbi.nih.gov American Heart Association: www.heart.org Contact a health care provider if: You think you are having a reaction to medicines you have taken. You have repeated (recurrent) headaches. You feel dizzy. You have swelling in your ankles. You have trouble with your vision. Get help right away if: You develop a severe headache or confusion. You have unusual weakness or numbness, or you feel faint. You have severe pain in your chest or abdomen. You vomit repeatedly. You have trouble breathing. These symptoms may represent a serious problem that is an emergency. Do not wait to see if the symptoms will go away. Get medical help right away. Call your local emergency services (911 in the [place] Do not drive yourself to the hospital. Summary Hypertension is when the force of blood pumping through your arteries is too strong. If this condition is not controlled, it may put you at risk for serious complications. Your personal target blood pressure may vary depending on   your medical conditions, your age, and other factors. For most people, a normal blood pressure is less than 120/80. Hypertension is managed by lifestyle changes, medicines, or both. Lifestyle changes to help manage hypertension include losing weight, eating a healthy, low-sodium diet, exercising more, stopping smoking, and limiting alcohol. This information is not intended to replace advice given to you by your health care provider. Make sure you discuss any questions  you have with your health care provider. Document Revised: 08/03/2019 Document Reviewed: 06/16/2019 Elsevier Patient Education  2022 Elsevier Inc.  

## 2021-11-27 ENCOUNTER — Encounter: Payer: Self-pay | Admitting: Nurse Practitioner

## 2021-11-27 ENCOUNTER — Ambulatory Visit: Payer: Self-pay | Admitting: Nurse Practitioner

## 2021-11-27 ENCOUNTER — Ambulatory Visit (INDEPENDENT_AMBULATORY_CARE_PROVIDER_SITE_OTHER): Payer: Medicare PPO | Admitting: Nurse Practitioner

## 2021-11-27 DIAGNOSIS — I10 Essential (primary) hypertension: Secondary | ICD-10-CM | POA: Diagnosis not present

## 2021-11-27 MED ORDER — AMLODIPINE BESYLATE 10 MG PO TABS: 10.0000 mg | ORAL_TABLET | Freq: Every day | ORAL | 0 refills | Status: AC

## 2021-11-27 NOTE — Progress Notes (Signed)
@Patient  ID: Darren Sherman, male    DOB: 07-12-1956, 66 y.o.   MRN: [id number]  Chief Complaint  Patient presents with   Follow-up    Pt is here for follow up visit.    Referring provider: Barbette Merino, NP   HPI  Subjective:  Darren Sherman is a 66 y.o. male with hypertension.  Patient is taking medications as instructed, no medication side effects noted, no TIA's, no chest pain on exertion, no dyspnea on exertion, and no swelling of ankles.   Forgot to take medications this morning.  New concerns: none.    No Known Allergies  Immunization History  Administered Date(s) Administered   PFIZER Comirnaty(Gray Top)Covid-19 Tri-Sucrose Vaccine 04/27/2020   PFIZER(Purple Top)SARS-COV-2 Vaccination 08/25/2019, 09/15/2019, 08/15/2021   Pneumococcal Conjugate-13 08/22/2020   Tdap 10/25/2016    Past Medical History:  Diagnosis Date   Blood transfusion without reported diagnosis    Hypertension     Tobacco History: Social History   Tobacco Use  Smoking Status Former   Packs/day: 0.60   Years: 35.00   Pack years: 21.00   Types: Cigarettes   Quit date: 2012   Years since quitting: 11.3  Smokeless Tobacco Never   Counseling given: Not Answered   Outpatient Encounter Medications as of 11/27/2021  Medication Sig   albuterol (VENTOLIN HFA) 108 (90 Base) MCG/ACT inhaler Inhale 1-2 puffs into the lungs every 6 (six) hours as needed for wheezing or shortness of breath.   [DISCONTINUED] amLODipine (NORVASC) 10 MG tablet Take 1 tablet (10 mg total) by mouth daily.   amLODipine (NORVASC) 10 MG tablet Take 1 tablet (10 mg total) by mouth daily.   No facility-administered encounter medications on file as of 11/27/2021.     Review of Systems  Review of Systems  Constitutional: Negative.   HENT: Negative.    Cardiovascular: Negative.   Gastrointestinal: Negative.   Allergic/Immunologic: Negative.   Neurological: Negative.   Psychiatric/Behavioral: Negative.         Physical Exam  BP (!) 137/94   Pulse 70   Temp 97.7 F (36.5 C)   Ht 5\' 6"  (1.676 m)   Wt 177 lb 8 oz (80.5 kg)   SpO2 99%   BMI 28.65 kg/m   Wt Readings from Last 5 Encounters:  11/27/21 177 lb 8 oz (80.5 kg)  10/23/21 179 lb 6.4 oz (81.4 kg)  08/28/21 181 lb 3.2 oz (82.2 kg)  05/26/21 179 lb 3.2 oz (81.3 kg)  02/23/21 175 lb 0.8 oz (79.4 kg)     Physical Exam Vitals and nursing note reviewed.  Constitutional:      General: He is not in acute distress.    Appearance: He is well-developed.  Cardiovascular:     Rate and Rhythm: Normal rate and regular rhythm.  Pulmonary:     Effort: Pulmonary effort is normal.     Breath sounds: Normal breath sounds.  Skin:    General: Skin is warm and dry.  Neurological:     Mental Status: He is alert and oriented to person, place, and time.     Lab Results:  CBC    Component Value Date/Time   WBC 7.5 08/15/2021 0912   RBC 4.95 08/15/2021 0912   HGB 12.8 (L) 08/15/2021 0912   HGB 15.2 12/30/2019 0954   HCT 41.6 08/15/2021 0912   HCT 45.1 12/30/2019 0954   PLT 212 08/15/2021 0912   PLT 198 12/30/2019 0954   MCV 84.0 08/15/2021 0912   MCV 88  12/30/2019 0954   MCH 25.9 (L) 08/15/2021 0912   MCHC 30.8 08/15/2021 0912   RDW 13.4 08/15/2021 0912   RDW 12.0 12/30/2019 0954   LYMPHSABS 1.5 12/30/2019 0954   MONOABS 0.5 08/09/2013 0202   EOSABS 0.1 12/30/2019 0954   BASOSABS 0.0 12/30/2019 0954    BMET    Component Value Date/Time   NA 136 08/15/2021 0912   NA 142 05/26/2021 0905   K 3.9 08/15/2021 0912   CL 105 08/15/2021 0912   CO2 25 08/15/2021 0912   GLUCOSE 89 08/15/2021 0912   BUN 8 08/15/2021 0912   BUN 12 05/26/2021 0905   CREATININE 0.78 08/15/2021 0912   CALCIUM 8.4 (L) 08/15/2021 0912   GFRNONAA >60 08/15/2021 0912   GFRAA 94 08/22/2020 1050    BNP    Component Value Date/Time   BNP 33.7 11/23/2020 1119    ProBNP No results found for: PROBNP  Imaging: No results found.   Assessment  & Plan:   Essential hypertension - amLODipine (NORVASC) 10 MG tablet; Take 1 tablet (10 mg total) by mouth daily.  Dispense: 90 tablet; Refill: 0   - low salt diet  - stay active  - continue to work on healthy weight loss  Follow up:  Follow up in 3 months or sooner if needed   Patient Instructions  1. Essential hypertension  - amLODipine (NORVASC) 10 MG tablet; Take 1 tablet (10 mg total) by mouth daily.  Dispense: 90 tablet; Refill: 0   - low salt diet  - stay active  - continue to work on healthy weight loss  Follow up:  Follow up in 3 months or sooner if needed  Managing Your Hypertension Hypertension, also called high blood pressure, is when the force of the blood pressing against the walls of the arteries is too strong. Arteries are blood vessels that carry blood from your heart throughout your body. Hypertension forces the heart to work harder to pump blood and may cause the arteries to become narrow or stiff. Understanding blood pressure readings A blood pressure reading includes a higher number over a lower number: The first, or top, number is called the systolic pressure. It is a measure of the pressure in your arteries as your heart beats. The second, or bottom number, is called the diastolic pressure. It is a measure of the pressure in your arteries as the heart relaxes. For most people, a normal blood pressure is below 120/80. Your personal target blood pressure may vary depending on your medical conditions, your age, and other factors. Blood pressure is classified into four stages. Based on your blood pressure reading, your health care provider may use the following stages to determine what type of treatment you need, if any. Systolic pressure and diastolic pressure are measured in a unit called millimeters of mercury (mmHg). Normal Systolic pressure: below 120. Diastolic pressure: below 80. Elevated Systolic pressure: 120-129. Diastolic pressure: below  80. Hypertension stage 1 Systolic pressure: 130-139. Diastolic pressure: 80-89. Hypertension stage 2 Systolic pressure: 140 or above. Diastolic pressure: 90 or above. How can this condition affect me? Managing your hypertension is very important. Over time, hypertension can damage the arteries and decrease blood flow to parts of the body, including the brain, heart, and kidneys. Having untreated or uncontrolled hypertension can lead to: A heart attack. A stroke. A weakened blood vessel (aneurysm). Heart failure. Kidney damage. Eye damage. Memory and concentration problems. Vascular dementia. What actions can I take to manage this condition?  Hypertension can be managed by making lifestyle changes and possibly by taking medicines. Your health care provider will help you make a plan to bring your blood pressure within a normal range. You may be referred for counseling on a healthy diet and physical activity. Nutrition  Eat a diet that is high in fiber and potassium, and low in salt (sodium), added sugar, and fat. An example eating plan is called the DASH diet. DASH stands for Dietary Approaches to Stop Hypertension. To eat this way: Eat plenty of fresh fruits and vegetables. Try to fill one-half of your plate at each meal with fruits and vegetables. Eat whole grains, such as whole-wheat pasta, brown rice, or whole-grain bread. Fill about one-fourth of your plate with whole grains. Eat low-fat dairy products. Avoid fatty cuts of meat, processed or cured meats, and poultry with skin. Fill about one-fourth of your plate with lean proteins such as fish, chicken without skin, beans, eggs, and tofu. Avoid pre-made and processed foods. These tend to be higher in sodium, added sugar, and fat. Reduce your daily sodium intake. Many people with hypertension should eat less than 1,500 mg of sodium a day. Lifestyle  Work with your health care provider to maintain a healthy body weight or to lose  weight. Ask what an ideal weight is for you. Get at least 30 minutes of exercise that causes your heart to beat faster (aerobic exercise) most days of the week. Activities may include walking, swimming, or biking. Include exercise to strengthen your muscles (resistance exercise), such as weight lifting, as part of your weekly exercise routine. Try to do these types of exercises for 30 minutes at least 3 days a week. Do not use any products that contain nicotine or tobacco. These products include cigarettes, chewing tobacco, and vaping devices, such as e-cigarettes. If you need help quitting, ask your health care provider. Control any long-term (chronic) conditions you have, such as high cholesterol or diabetes. Identify your sources of stress and find ways to manage stress. This may include meditation, deep breathing, or making time for fun activities. Alcohol use Do not drink alcohol if: Your health care provider tells you not to drink. You are pregnant, may be pregnant, or are planning to become pregnant. If you drink alcohol: Limit how much you have to: 0-1 drink a day for women. 0-2 drinks a day for men. Know how much alcohol is in your drink. In the [place] one drink equals one 12 oz bottle of beer (355 mL), one 5 oz glass of wine (148 mL), or one 1 oz glass of hard liquor (44 mL). Medicines Your health care provider may prescribe medicine if lifestyle changes are not enough to get your blood pressure under control and if: Your systolic blood pressure is 130 or higher. Your diastolic blood pressure is 80 or higher. Take medicines only as told by your health care provider. Follow the directions carefully. Blood pressure medicines must be taken as told by your health care provider. The medicine does not work as well when you skip doses. Skipping doses also puts you at risk for problems. Monitoring Before you monitor your blood pressure: Do not smoke, drink caffeinated beverages, or exercise  within 30 minutes before taking a measurement. Use the bathroom and empty your bladder (urinate). Sit quietly for at least 5 minutes before taking measurements. Monitor your blood pressure at home as told by your health care provider. To do this: Sit with your back straight and supported. Place your  feet flat on the floor. Do not cross your legs. Support your arm on a flat surface, such as a table. Make sure your upper arm is at heart level. Each time you measure, take two or three readings one minute apart and record the results. You may also need to have your blood pressure checked regularly by your health care provider. General information Talk with your health care provider about your diet, exercise habits, and other lifestyle factors that may be contributing to hypertension. Review all the medicines you take with your health care provider because there may be side effects or interactions. Keep all follow-up visits. Your health care provider can help you create and adjust your plan for managing your high blood pressure. Where to find more information National Heart, Lung, and Blood Institute: [employer].is American Heart Association: www.heart.org Contact a health care provider if: You think you are having a reaction to medicines you have taken. You have repeated (recurrent) headaches. You feel dizzy. You have swelling in your ankles. You have trouble with your vision. Get help right away if: You develop a severe headache or confusion. You have unusual weakness or numbness, or you feel faint. You have severe pain in your chest or abdomen. You vomit repeatedly. You have trouble breathing. These symptoms may be an emergency. Get help right away. Call 911. Do not wait to see if the symptoms will go away. Do not drive yourself to the hospital. Summary Hypertension is when the force of blood pumping through your arteries is too strong. If this condition is not controlled, it may  put you at risk for serious complications. Your personal target blood pressure may vary depending on your medical conditions, your age, and other factors. For most people, a normal blood pressure is less than 120/80. Hypertension is managed by lifestyle changes, medicines, or both. Lifestyle changes to help manage hypertension include losing weight, eating a healthy, low-sodium diet, exercising more, stopping smoking, and limiting alcohol. This information is not intended to replace advice given to you by your health care provider. Make sure you discuss any questions you have with your health care provider. Document Revised: 03/30/2021 Document Reviewed: 03/30/2021 Elsevier Patient Education  [address]    Ivonne Andrew, [place] 11/27/2021

## 2021-11-27 NOTE — Assessment & Plan Note (Signed)
-   amLODipine (NORVASC) 10 MG tablet; Take 1 tablet (10 mg total) by mouth daily.  Dispense: 90 tablet; Refill: 0 ? ? - low salt diet ? ?- stay active ? ?- continue to work on healthy weight loss ? ?Follow up: ? ?Follow up in 3 months or sooner if needed ?

## 2021-11-27 NOTE — Patient Instructions (Signed)
1. Essential hypertension ? ?- amLODipine (NORVASC) 10 MG tablet; Take 1 tablet (10 mg total) by mouth daily.  Dispense: 90 tablet; Refill: 0 ? ? - low salt diet ? ?- stay active ? ?- continue to work on healthy weight loss ? ?Follow up: ? ?Follow up in 3 months or sooner if needed ? ?Managing Your Hypertension ?Hypertension, also called high blood pressure, is when the force of the blood pressing against the walls of the arteries is too strong. Arteries are blood vessels that carry blood from your heart throughout your body. Hypertension forces the heart to work harder to pump blood and may cause the arteries to become narrow or stiff. ?Understanding blood pressure readings ?A blood pressure reading includes a higher number over a lower number: ?The first, or top, number is called the systolic pressure. It is a measure of the pressure in your arteries as your heart beats. ?The second, or bottom number, is called the diastolic pressure. It is a measure of the pressure in your arteries as the heart relaxes. ?For most people, a normal blood pressure is below 120/80. Your personal target blood pressure may vary depending on your medical conditions, your age, and other factors. ?Blood pressure is classified into four stages. Based on your blood pressure reading, your health care provider may use the following stages to determine what type of treatment you need, if any. Systolic pressure and diastolic pressure are measured in a unit called millimeters of mercury (mmHg). ?Normal ?Systolic pressure: below 120. ?Diastolic pressure: below 80. ?Elevated ?Systolic pressure: 120-129. ?Diastolic pressure: below 80. ?Hypertension stage 1 ?Systolic pressure: 130-139. ?Diastolic pressure: 80-89. ?Hypertension stage 2 ?Systolic pressure: 140 or above. ?Diastolic pressure: 90 or above. ?How can this condition affect me? ?Managing your hypertension is very important. Over time, hypertension can damage the arteries and decrease blood  flow to parts of the body, including the brain, heart, and kidneys. Having untreated or uncontrolled hypertension can lead to: ?A heart attack. ?A stroke. ?A weakened blood vessel (aneurysm). ?Heart failure. ?Kidney damage. ?Eye damage. ?Memory and concentration problems. ?Vascular dementia. ?What actions can I take to manage this condition? ?Hypertension can be managed by making lifestyle changes and possibly by taking medicines. Your health care provider will help you make a plan to bring your blood pressure within a normal range. You may be referred for counseling on a healthy diet and physical activity. ?Nutrition ? ?Eat a diet that is high in fiber and potassium, and low in salt (sodium), added sugar, and fat. An example eating plan is called the DASH diet. DASH stands for Dietary Approaches to Stop Hypertension. To eat this way: ?Eat plenty of fresh fruits and vegetables. Try to fill one-half of your plate at each meal with fruits and vegetables. ?Eat whole grains, such as whole-wheat pasta, brown rice, or whole-grain bread. Fill about one-fourth of your plate with whole grains. ?Eat low-fat dairy products. ?Avoid fatty cuts of meat, processed or cured meats, and poultry with skin. Fill about one-fourth of your plate with lean proteins such as fish, chicken without skin, beans, eggs, and tofu. ?Avoid pre-made and processed foods. These tend to be higher in sodium, added sugar, and fat. ?Reduce your daily sodium intake. Many people with hypertension should eat less than 1,500 mg of sodium a day. ?Lifestyle ? ?Work with your health care provider to maintain a healthy body weight or to lose weight. Ask what an ideal weight is for you. ?Get at least 30 minutes of  exercise that causes your heart to beat faster (aerobic exercise) most days of the week. Activities may include walking, swimming, or biking. ?Include exercise to strengthen your muscles (resistance exercise), such as weight lifting, as part of your  weekly exercise routine. Try to do these types of exercises for 30 minutes at least 3 days a week. ?Do not use any products that contain nicotine or tobacco. These products include cigarettes, chewing tobacco, and vaping devices, such as e-cigarettes. If you need help quitting, ask your health care provider. ?Control any long-term (chronic) conditions you have, such as high cholesterol or diabetes. ?Identify your sources of stress and find ways to manage stress. This may include meditation, deep breathing, or making time for fun activities. ?Alcohol use ?Do not drink alcohol if: ?Your health care provider tells you not to drink. ?You are pregnant, may be pregnant, or are planning to become pregnant. ?If you drink alcohol: ?Limit how much you have to: ?0-1 drink a day for women. ?0-2 drinks a day for men. ?Know how much alcohol is in your drink. In the [place] one drink equals one 12 oz bottle of beer (355 mL), one 5 oz glass of wine (148 mL), or one 1? oz glass of hard liquor (44 mL). ?Medicines ?Your health care provider may prescribe medicine if lifestyle changes are not enough to get your blood pressure under control and if: ?Your systolic blood pressure is 130 or higher. ?Your diastolic blood pressure is 80 or higher. ?Take medicines only as told by your health care provider. Follow the directions carefully. Blood pressure medicines must be taken as told by your health care provider. The medicine does not work as well when you skip doses. Skipping doses also puts you at risk for problems. ?Monitoring ?Before you monitor your blood pressure: ?Do not smoke, drink caffeinated beverages, or exercise within 30 minutes before taking a measurement. ?Use the bathroom and empty your bladder (urinate). ?Sit quietly for at least 5 minutes before taking measurements. ?Monitor your blood pressure at home as told by your health care provider. To do this: ?Sit with your back straight and supported. ?Place your feet flat on the  floor. Do not cross your legs. ?Support your arm on a flat surface, such as a table. Make sure your upper arm is at heart level. ?Each time you measure, take two or three readings one minute apart and record the results. ?You may also need to have your blood pressure checked regularly by your health care provider. ?General information ?Talk with your health care provider about your diet, exercise habits, and other lifestyle factors that may be contributing to hypertension. ?Review all the medicines you take with your health care provider because there may be side effects or interactions. ?Keep all follow-up visits. Your health care provider can help you create and adjust your plan for managing your high blood pressure. ?Where to find more information ?National Heart, Lung, and Blood Institute: PopSteam.is ?American Heart Association: www.heart.org ?Contact a health care provider if: ?You think you are having a reaction to medicines you have taken. ?You have repeated (recurrent) headaches. ?You feel dizzy. ?You have swelling in your ankles. ?You have trouble with your vision. ?Get help right away if: ?You develop a severe headache or confusion. ?You have unusual weakness or numbness, or you feel faint. ?You have severe pain in your chest or abdomen. ?You vomit repeatedly. ?You have trouble breathing. ?These symptoms may be an emergency. Get help right away. Call 911. ?Do not  wait to see if the symptoms will go away. ?Do not drive yourself to the hospital. ?Summary ?Hypertension is when the force of blood pumping through your arteries is too strong. If this condition is not controlled, it may put you at risk for serious complications. ?Your personal target blood pressure may vary depending on your medical conditions, your age, and other factors. For most people, a normal blood pressure is less than 120/80. ?Hypertension is managed by lifestyle changes, medicines, or both. ?Lifestyle changes to help manage  hypertension include losing weight, eating a healthy, low-sodium diet, exercising more, stopping smoking, and limiting alcohol. ?This information is not intended to replace advice given to you by your health care

## 2021-11-28 LAB — COMPREHENSIVE METABOLIC PANEL
ALT: 40 IU/L (ref 0–44)
AST: 33 IU/L (ref 0–40)
Albumin/Globulin Ratio: 1.7 (ref 1.2–2.2)
Albumin: 4.4 g/dL (ref 3.8–4.8)
Alkaline Phosphatase: 112 IU/L (ref 44–121)
BUN/Creatinine Ratio: 13 (ref 10–24)
BUN: 12 mg/dL (ref 8–27)
Bilirubin Total: 0.3 mg/dL (ref 0.0–1.2)
CO2: 25 mmol/L (ref 20–29)
Calcium: 9.6 mg/dL (ref 8.6–10.2)
Chloride: 105 mmol/L (ref 96–106)
Creatinine, Ser: 0.92 mg/dL (ref 0.76–1.27)
Globulin, Total: 2.6 g/dL (ref 1.5–4.5)
Glucose: 76 mg/dL (ref 70–99)
Potassium: 4.7 mmol/L (ref 3.5–5.2)
Sodium: 141 mmol/L (ref 134–144)
Total Protein: 7 g/dL (ref 6.0–8.5)
eGFR: 92 mL/min/{1.73_m2} (ref >59)

## 2021-11-28 LAB — CBC
Hematocrit: 42.6 % (ref 37.5–51.0)
Hemoglobin: 13.7 g/dL (ref 13.0–17.7)
MCH: 25.7 pg — ABNORMAL LOW (ref 26.6–33.0)
MCHC: 32.2 g/dL (ref 31.5–35.7)
MCV: 80 fL (ref 79–97)
Platelets: 202 10*3/uL (ref 150–450)
RBC: 5.34 x10E6/uL (ref 4.14–5.80)
RDW: 14 % (ref 11.6–15.4)
WBC: 3.9 10*3/uL (ref 3.4–10.8)

## 2022-01-24 IMAGING — CT CT CHEST LUNG CANCER SCREENING LOW DOSE W/O CM
1 series · 10 of 10 positions shown, 13 images · non-contrast
Comparison: None.

CLINICAL DATA: Current smoker, 20 pack-year history.



[ct lung segmentation data · axial · 0.68mm/px · z∈[-329,-329]mm · 10 of 302 frames shown]
[frame 1/302  mediastinal]
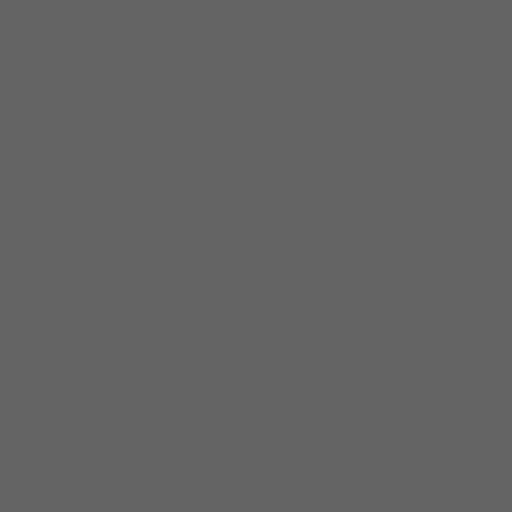
[frame 1/302  lung]
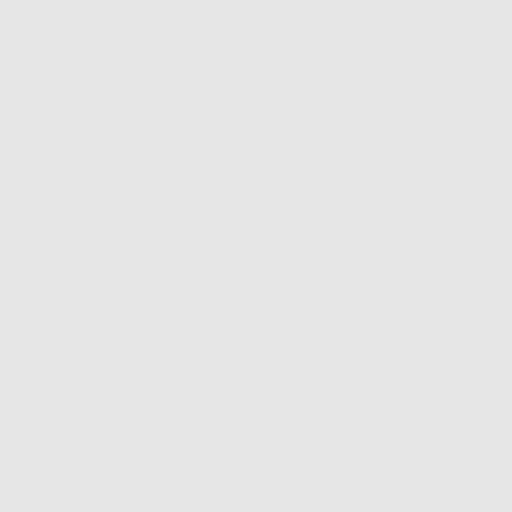
[frame 34/302  lung]
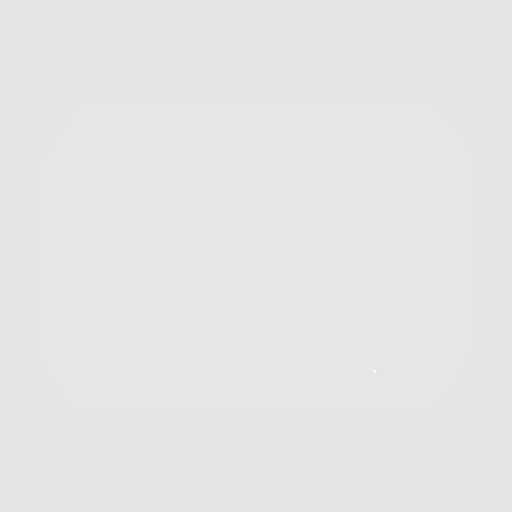
[frame 67/302  lung]
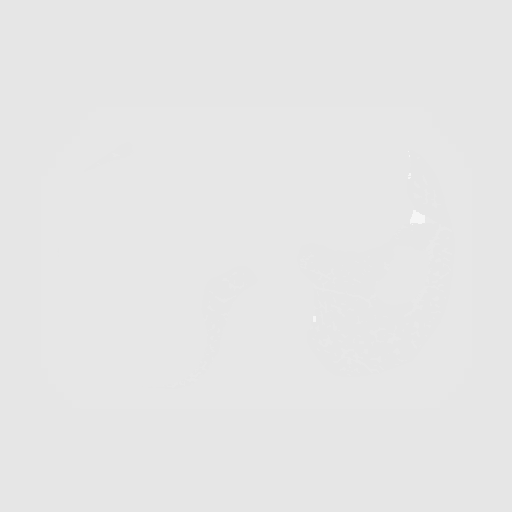
[frame 101/302  lung]
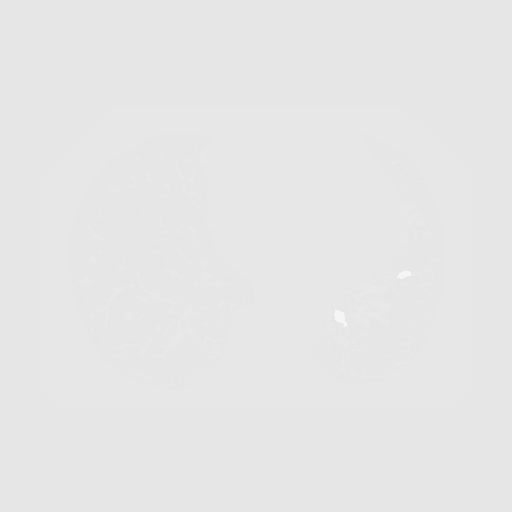
[frame 134/302  mediastinal]
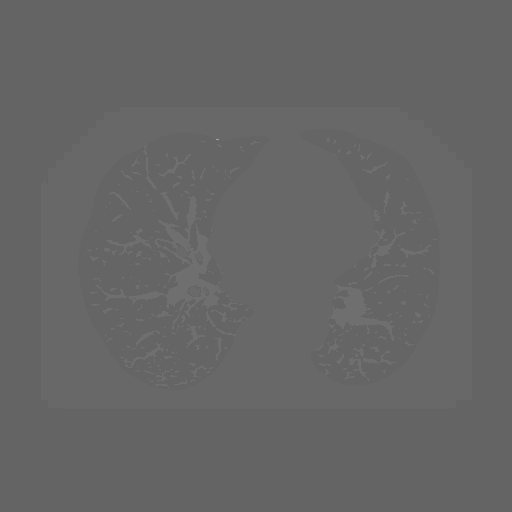
[frame 134/302  lung]
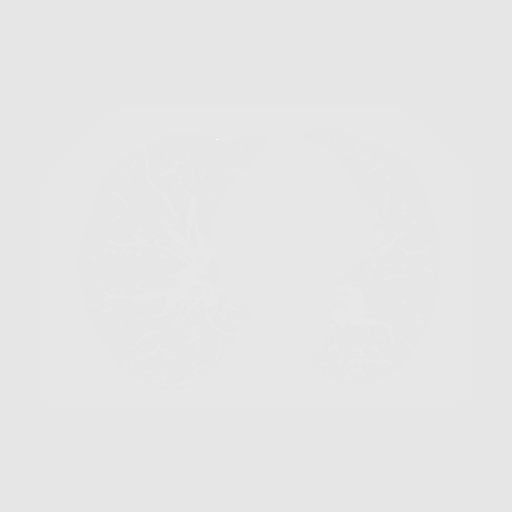
[frame 168/302  lung]
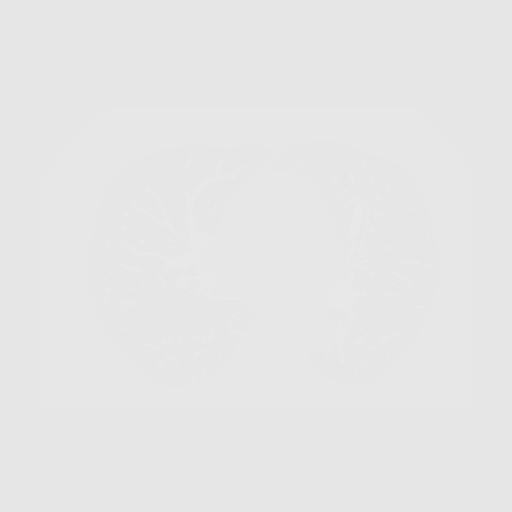
[frame 201/302  lung]
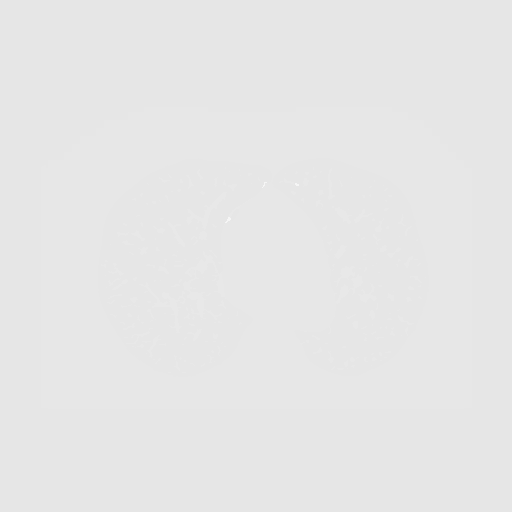
[frame 235/302  lung]
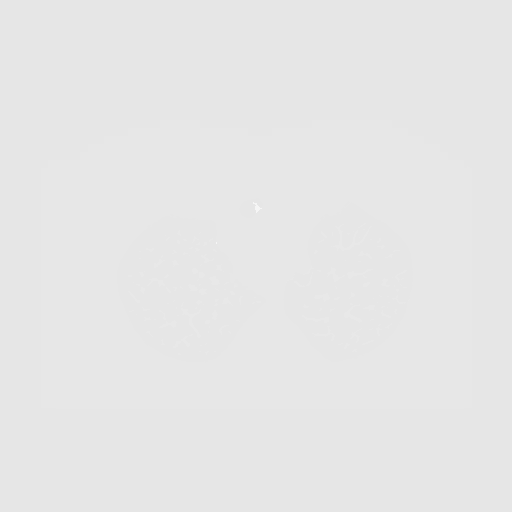
[frame 268/302  mediastinal]
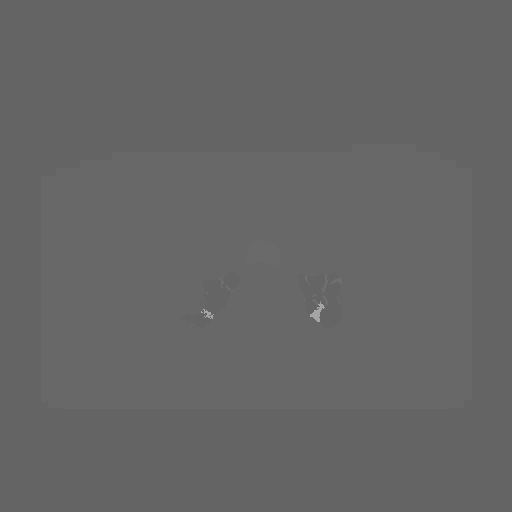
[frame 268/302  lung]
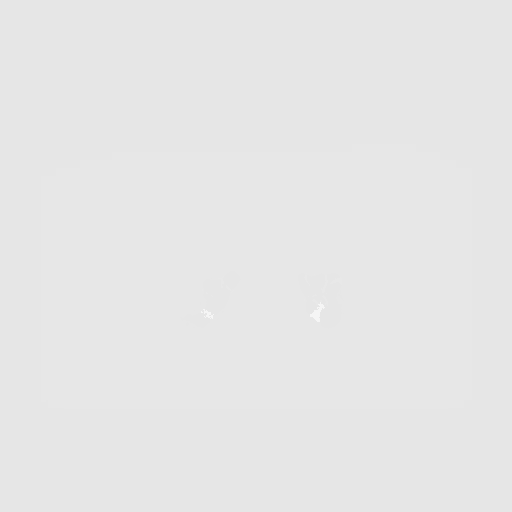
[frame 302/302  lung]
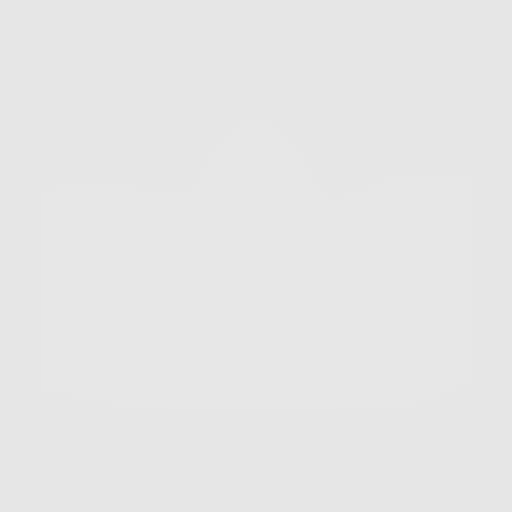

[10 of 10 positions shown; findings below may reference images not displayed]

FINDINGS: Cardiovascular: Enlarged pulmonic trunk. Heart size normal. No
pericardial effusion.

Mediastinum/Nodes: 1.4 cm subcarinal lymph node. Hilar regions are
difficult to definitively evaluate without IV contrast. No axillary
adenopathy. Mild esophageal dilatation can be seen with dysmotility.

Lungs/Pleura: Centrilobular and paraseptal emphysema. Mild bibasilar
scarring. 4.2 mm subpleural left upper lobe nodule. No suspicious
pulmonary nodules. No pleural fluid. Airway is unremarkable.

Upper Abdomen: Visualized portions of the liver, gallbladder,
adrenal glands, kidneys, spleen, pancreas, stomach and bowel are
grossly unremarkable.

Musculoskeletal: No worrisome lytic or sclerotic lesions.
IMPRESSION: 1. Lung-RADS 2, benign appearance or behavior. Continue annual
screening with low-dose chest CT without contrast in 12 months.
2. Enlarged pulmonic trunk, indicative of pulmonary arterial
hypertension.
3.  Emphysema ([id number]).

## 2022-05-30 ENCOUNTER — Ambulatory Visit: Payer: Self-pay | Admitting: Nurse Practitioner

## 2022-07-19 ENCOUNTER — Telehealth: Payer: Self-pay | Admitting: Nurse Practitioner

## 2022-07-19 NOTE — Telephone Encounter (Signed)
Left message for patient to call back and schedule Medicare Annual Wellness Visit (AWV).   Please offer to do virtually or by telephone.   Last AWV:  01/09/2021   Schedule at any time with [facility] Nurse Health Advisor   30 minute appointment for Virtual or phone  45 minute appointment for Initial virtual/phone  Any questions, please contact me at [phone]   Thank you,   [facility]  Ambulatory Clinical Support for Care Management [facility] You Are. We Are. [facility] ??[phone] or ??[phone]

## 2022-08-20 ENCOUNTER — Other Ambulatory Visit: Payer: Self-pay

## 2022-08-20 DIAGNOSIS — Z87891 Personal history of nicotine dependence: Secondary | ICD-10-CM

## 2022-08-20 DIAGNOSIS — Z122 Encounter for screening for malignant neoplasm of respiratory organs: Secondary | ICD-10-CM

## 2022-08-22 ENCOUNTER — Ambulatory Visit (HOSPITAL_COMMUNITY): Payer: Self-pay

## 2022-09-13 ENCOUNTER — Ambulatory Visit (HOSPITAL_COMMUNITY): Payer: Self-pay

## 2023-05-08 ENCOUNTER — Ambulatory Visit
Admission: RE | Admit: 2023-05-08 | Discharge: 2023-05-08 | Disposition: A | Discharge status: Home / Self Care | Payer: Medicare PPO | Source: Ambulatory Visit | Attending: Nurse Practitioner | Admitting: Nurse Practitioner

## 2023-05-08 ENCOUNTER — Other Ambulatory Visit: Payer: Self-pay | Admitting: Nurse Practitioner

## 2023-05-08 ENCOUNTER — Ambulatory Visit
Admission: RE | Admit: 2023-05-08 | Discharge: 2023-05-08 | Disposition: A | Discharge status: Home / Self Care | Payer: Medicare PPO | Attending: Nurse Practitioner | Admitting: Nurse Practitioner

## 2023-05-08 DIAGNOSIS — R0989 Other specified symptoms and signs involving the circulatory and respiratory systems: Secondary | ICD-10-CM

## 2023-05-08 DIAGNOSIS — S63611A Unspecified sprain of left index finger, initial encounter: Secondary | ICD-10-CM

## 2023-08-07 ENCOUNTER — Encounter: Payer: Self-pay | Admitting: Acute Care
